# Patient Record
Sex: Female | Born: 1950
Health system: Southern US, Community
[De-identification: ages and names within clinical notes are randomized; demographics above are authoritative.]

## PROBLEM LIST (undated history)

## (undated) DIAGNOSIS — D649 Anemia, unspecified: Secondary | ICD-10-CM

## (undated) DIAGNOSIS — I639 Cerebral infarction, unspecified: Secondary | ICD-10-CM

## (undated) DIAGNOSIS — G459 Transient cerebral ischemic attack, unspecified: Secondary | ICD-10-CM

## (undated) HISTORY — DX: Transient cerebral ischemic attack, unspecified: G45.9

## (undated) HISTORY — DX: Anemia, unspecified: D64.9

---
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
## 2015-05-25 DIAGNOSIS — Z23 Encounter for immunization: Secondary | ICD-10-CM | POA: Diagnosis not present

## 2015-05-25 DIAGNOSIS — M5432 Sciatica, left side: Secondary | ICD-10-CM | POA: Diagnosis not present

## 2016-08-23 DIAGNOSIS — D509 Iron deficiency anemia, unspecified: Secondary | ICD-10-CM | POA: Diagnosis not present

## 2016-08-23 DIAGNOSIS — E785 Hyperlipidemia, unspecified: Secondary | ICD-10-CM | POA: Diagnosis not present

## 2016-08-23 DIAGNOSIS — M2550 Pain in unspecified joint: Secondary | ICD-10-CM | POA: Diagnosis not present

## 2016-08-23 DIAGNOSIS — Z1159 Encounter for screening for other viral diseases: Secondary | ICD-10-CM | POA: Diagnosis not present

## 2016-08-23 DIAGNOSIS — Z23 Encounter for immunization: Secondary | ICD-10-CM | POA: Diagnosis not present

## 2016-08-23 DIAGNOSIS — R609 Edema, unspecified: Secondary | ICD-10-CM | POA: Diagnosis not present

## 2016-08-23 DIAGNOSIS — E559 Vitamin D deficiency, unspecified: Secondary | ICD-10-CM | POA: Diagnosis not present

## 2019-08-04 ENCOUNTER — Emergency Department (HOSPITAL_COMMUNITY): Payer: Medicare Other

## 2019-08-04 ENCOUNTER — Emergency Department (HOSPITAL_COMMUNITY)
Admission: EM | Admit: 2019-08-04 | Discharge: 2019-08-04 | Discharge status: Against Medical Advice | Payer: Medicare Other | Attending: Emergency Medicine | Admitting: Emergency Medicine

## 2019-08-04 ENCOUNTER — Other Ambulatory Visit: Payer: Self-pay

## 2019-08-04 DIAGNOSIS — R413 Other amnesia: Secondary | ICD-10-CM | POA: Insufficient documentation

## 2019-08-04 DIAGNOSIS — Z20822 Contact with and (suspected) exposure to covid-19: Secondary | ICD-10-CM | POA: Insufficient documentation

## 2019-08-04 DIAGNOSIS — Z5320 Procedure and treatment not carried out because of patient's decision for unspecified reasons: Secondary | ICD-10-CM | POA: Insufficient documentation

## 2019-08-04 LAB — CBC
HCT: 40.1 % (ref 36.0–46.0)
Hemoglobin: 12.9 g/dL (ref 12.0–15.0)
MCH: 25.9 pg — ABNORMAL LOW (ref 26.0–34.0)
MCHC: 32.2 g/dL (ref 30.0–36.0)
MCV: 80.5 fL (ref 80.0–100.0)
Platelets: 302 10*3/uL (ref 150–400)
RBC: 4.98 MIL/uL (ref 3.87–5.11)
RDW: 13.2 % (ref 11.5–15.5)
WBC: 5.8 10*3/uL (ref 4.0–10.5)
nRBC: 0 % (ref 0.0–0.2)

## 2019-08-04 LAB — DIFFERENTIAL
Abs Immature Granulocytes: 0.01 10*3/uL (ref 0.00–0.07)
Basophils Absolute: 0 10*3/uL (ref 0.0–0.1)
Basophils Relative: 1 %
Eosinophils Absolute: 0.1 10*3/uL (ref 0.0–0.5)
Eosinophils Relative: 2 %
Immature Granulocytes: 0 %
Lymphocytes Relative: 33 %
Lymphs Abs: 1.9 10*3/uL (ref 0.7–4.0)
Monocytes Absolute: 0.4 10*3/uL (ref 0.1–1.0)
Monocytes Relative: 7 %
Neutro Abs: 3.4 10*3/uL (ref 1.7–7.7)
Neutrophils Relative %: 57 %

## 2019-08-04 LAB — PROTIME-INR
INR: 1 (ref 0.8–1.2)
Prothrombin Time: 13.3 seconds (ref 11.4–15.2)

## 2019-08-04 LAB — COMPREHENSIVE METABOLIC PANEL
ALT: 29 U/L (ref 0–44)
AST: 33 U/L (ref 15–41)
Albumin: 4.4 g/dL (ref 3.5–5.0)
Alkaline Phosphatase: 53 U/L (ref 38–126)
Anion gap: 11 (ref 5–15)
BUN: 19 mg/dL (ref 8–23)
CO2: 26 mmol/L (ref 22–32)
Calcium: 9.7 mg/dL (ref 8.9–10.3)
Chloride: 105 mmol/L (ref 98–111)
Creatinine, Ser: 0.75 mg/dL (ref 0.44–1.00)
GFR calc Af Amer: >60 mL/min (ref >60)
GFR calc non Af Amer: >60 mL/min (ref >60)
Glucose, Bld: 165 mg/dL — ABNORMAL HIGH (ref 70–99)
Potassium: 3.7 mmol/L (ref 3.5–5.1)
Sodium: 142 mmol/L (ref 135–145)
Total Bilirubin: 0.7 mg/dL (ref 0.3–1.2)
Total Protein: 7.7 g/dL (ref 6.5–8.1)

## 2019-08-04 LAB — URINALYSIS, ROUTINE W REFLEX MICROSCOPIC
Bilirubin Urine: NEGATIVE
Glucose, UA: NEGATIVE mg/dL
Hgb urine dipstick: NEGATIVE
Ketones, ur: NEGATIVE mg/dL
Leukocytes,Ua: NEGATIVE
Nitrite: NEGATIVE
Protein, ur: NEGATIVE mg/dL
Specific Gravity, Urine: 1.005 (ref 1.005–1.030)
pH: 6 (ref 5.0–8.0)

## 2019-08-04 LAB — POC SARS CORONAVIRUS 2 AG -  ED: SARS Coronavirus 2 Ag: NEGATIVE

## 2019-08-04 LAB — CBG MONITORING, ED: Glucose-Capillary: 168 mg/dL — ABNORMAL HIGH (ref 70–99)

## 2019-08-04 LAB — APTT: aPTT: 32 seconds (ref 24–36)

## 2019-08-04 MED ORDER — ASPIRIN 81 MG PO CHEW
81.0000 mg | CHEWABLE_TABLET | Freq: Once | ORAL | Status: AC
  Administered 2019-08-04: 81 mg via ORAL
  Filled 2019-08-04: qty 1

## 2019-08-04 MED ORDER — ASPIRIN 81 MG PO CHEW: 81.0000 mg | CHEWABLE_TABLET | Freq: Every day | ORAL | 0 refills | Status: AC

## 2019-08-04 MED ORDER — SODIUM CHLORIDE 0.9% FLUSH
3.0000 mL | Freq: Once | INTRAVENOUS | Status: AC
  Administered 2019-08-04: 3 mL via INTRAVENOUS

## 2019-08-04 NOTE — ED Notes (Signed)
Pt ambulatory to RR independently.   Pt is able to follow commands

## 2019-08-04 NOTE — Discharge Instructions (Signed)
Toni Davila was seen in the ER today for an episode of confusion this morning.  Toni Davila symptoms are resolved since Toni Davila arrival in the ER.    Toni Davila workup in the ER did not show a medical emergency.  However, we recommended admitting Toni Davila overnight to the hospital for more testing - including an MRI.  It is possible she had a "mini stroke, " or TIA, or another medical condition causing Toni Davila confusion today.  We are not able to perform a complete medical evaluation in the emergency room.  She decided to go home instead and signed herself out against medical advice.  Toni Davila daughter at bedside agreed that they would like to leave against our medical advice.  Both patient and daughter understood the risks of leaving, including a stroke at home, paralysis, or even death.  I advised that she start taking aspirin 81 mg daily.  I also advised that she follow up with the neurology office this week.  If Toni Davila has any new or concerning symptoms, including difficulty speaking, slurred speech, drooping face, weakness in Toni Davila arms or legs, chest pain, or lightheadedness, please return to the emergency room immediately.  Thank you.

## 2019-08-04 NOTE — ED Triage Notes (Signed)
Pt to ed with c/o of memory loss this morning. Pt usually knows family members name but this morning could not remember anyone's name. EMS was called out and they did a EKG but patient wouldn't come with them to hospital. Last known normal was last night at 2000.

## 2019-08-04 NOTE — ED Notes (Signed)
Pt belongings returned and is ambulatory out of ED.   

## 2019-08-04 NOTE — ED Notes (Signed)
Pt primary language is [place]. [facility] doe snot offer select service. Interceptor services provided by secretary [phone] and there was not an appropriate Translator availible at this time.   Pt daughter at bedside able to offer some translation.

## 2019-08-04 NOTE — ED Provider Notes (Signed)
[facility]-EMERGENCY DEPT Provider Note   CSN: [id number] Arrival date & time: 08/04/19  1549     History Chief Complaint  Patient presents with  . Memory Loss    Toni Davila is a 69 y.o. female who is [place] speaking (our translators did not understand her dialect, but her daughter and niece by phone were able to translate into [place], she speaks [place] ?), presenting to the ED with abrupt memory loss.  Family members report the patient was in her usual state of health yesterday and feeling well.  She went to bed normally.  However this morning when they came by to visit her she did not recognize the family members names.  She knew who the family numbers were but cannot recall any details.  Normally she is fully oriented and mentally sharp.  They deny that she had any clear motor deficits, including facial droop, weakness in her arms, weakness in her legs.  They deny that she had any slurred speech.  The patient was not complaining of a headache or any other complaints. They said this is never happened to her before.  Family members deny that the patient has any medical issues including urinary infections.  They deny that she had any fevers or chills earlier today.  They deny she has a hx of stroke, TIA, or smoking.  They tell me she is extremely healthy, extremely active, and takes no medications.  Since arriving in the ED, bedside evaluation, the patient was able to name her daughter accurately.  It appears that her memory issues had largely resolved.  She has no complaints for me.    HPI     No past medical history on file.  There are no problems to display for this patient.    OB History   No obstetric history on file.     No family history on file.  Social History   Tobacco Use  . Smoking status: Not on file  Substance Use Topics  . Alcohol use: Not on file  . Drug use: Not on file    Home Medications Prior to Admission  medications   Medication Sig Start Date End Date Taking? Authorizing Provider  aspirin 81 MG chewable tablet Chew 1 tablet (81 mg total) by mouth daily. 08/05/19 11/03/19  Terald Sleeper, MD    Allergies    Patient has no allergy information on record.  Review of Systems   Review of Systems  Constitutional: Negative for chills and fever.  Eyes: Negative for pain and visual disturbance.  Respiratory: Negative for cough and shortness of breath.   Cardiovascular: Negative for chest pain and palpitations.  Gastrointestinal: Negative for abdominal pain, nausea and vomiting.  Genitourinary: Negative for dysuria and hematuria.  Musculoskeletal: Negative for arthralgias and back pain.  Skin: Negative for color change and rash.  Neurological: Negative for syncope, light-headedness and headaches.  Psychiatric/Behavioral: Positive for confusion. Negative for agitation.  All other systems reviewed and are negative.   Physical Exam Updated Vital Signs BP (!) 155/79   Pulse (!) 57   Temp 98 F (36.7 C) (Oral)   Resp 15   Ht 5\' 2"  (1.575 m)   Wt 50.8 kg   SpO2 99%   BMI 20.49 kg/m   Physical Exam Vitals and nursing note reviewed.  Constitutional:      General: She is not in acute distress.    Appearance: She is well-developed. She is not ill-appearing.  HENT:  Head: Normocephalic and atraumatic.  Eyes:     Conjunctiva/sclera: Conjunctivae normal.  Cardiovascular:     Rate and Rhythm: Normal rate and regular rhythm.     Pulses: Normal pulses.  Pulmonary:     Effort: Pulmonary effort is normal. No respiratory distress.     Breath sounds: Normal breath sounds.  Abdominal:     Palpations: Abdomen is soft.     Tenderness: There is no abdominal tenderness.  Musculoskeletal:     Cervical back: Normal range of motion and neck supple.  Skin:    General: Skin is warm and dry.  Neurological:     General: No focal deficit present.     Mental Status: She is alert. Mental status is  at baseline.     GCS: GCS eye subscore is 4. GCS verbal subscore is 5. GCS motor subscore is 6.     Cranial Nerves: No cranial nerve deficit.     Sensory: No sensory deficit.     Motor: Motor function is intact. No weakness.     Coordination: Coordination is intact.     Gait: Gait is intact.  Psychiatric:        Mood and Affect: Mood normal.        Behavior: Behavior normal.     ED Results / Procedures / Treatments   Labs (all labs ordered are listed, but only abnormal results are displayed) Labs Reviewed  CBC - Abnormal; Notable for the following components:      Result Value   MCH 25.9 (*)    All other components within normal limits  COMPREHENSIVE METABOLIC PANEL - Abnormal; Notable for the following components:   Glucose, Bld 165 (*)    All other components within normal limits  URINALYSIS, ROUTINE W REFLEX MICROSCOPIC - Abnormal; Notable for the following components:   Color, Urine STRAW (*)    All other components within normal limits  CBG MONITORING, ED - Abnormal; Notable for the following components:   Glucose-Capillary 168 (*)    All other components within normal limits  PROTIME-INR  APTT  DIFFERENTIAL  I-STAT CHEM 8, ED  POC SARS CORONAVIRUS 2 AG -  ED    EKG EKG Interpretation  Date/Time:  Sunday August 04 2019 16:38:14 EDT Ventricular Rate:  63 PR Interval:    QRS Duration: 113 QT Interval:  427 QTC Calculation: 438 R Axis:   17 Text Interpretation: Sinus rhythm Incomplete right bundle branch block No STEMI Confirmed by Octaviano Glow [phone]) on 08/04/2019 5:32:15 PM   Radiology CT HEAD WO CONTRAST  Result Date: 08/04/2019 CLINICAL DATA:  Memory loss EXAM: CT HEAD WITHOUT CONTRAST TECHNIQUE: Contiguous axial images were obtained from the base of the skull through the vertex without intravenous contrast. COMPARISON:  None. FINDINGS: Brain: No acute intracranial abnormality. Specifically, no hemorrhage, hydrocephalus, mass lesion, acute infarction, or  significant intracranial injury. Vascular: No hyperdense vessel or unexpected calcification. Skull: No acute calvarial abnormality. Sinuses/Orbits: Visualized paranasal sinuses and mastoids clear. Orbital soft tissues unremarkable. Other: None IMPRESSION: No acute intracranial abnormality. Electronically Signed   By: Rolm Baptise M.D.   On: 08/04/2019 16:37    Procedures Procedures (including critical care time)  Medications Ordered in ED Medications  sodium chloride flush (NS) 0.9 % injection 3 mL (3 mLs Intravenous Given 08/04/19 1645)  aspirin chewable tablet 81 mg (81 mg Oral Given 08/04/19 1949)    ED Course  I have reviewed the triage vital signs and the nursing notes.  Pertinent labs &  imaging results that were available during my care of the patient were reviewed by me and considered in my medical decision making (see chart for details).  69 yo female here with transient episode of confusion this morning, now resolved.  Specifically she had disorientation and difficulty naming her family members this morning, which is out of character for her, but she had NO aphasia, slurred speech, or other neurological deficits.    Translation is tricky here as she speaks a dialect of [place] our interpreter services are unfamiliar with.  We rely on her daughter and her niece (who is a Orthoptist, apparently) on the phone, to talk to her and to [place].    She has a completely benign neurological exam.  I certainly don't think this is a stroke, but will discuss with neurology after completing a metabolic and infectious evaluation.    Low suspicion for SAH, meningitis.  This patient complains of confusion.  This involves an extensive number of treatment options, and is a complaint that carries with it a high risk of complications and morbidity.  The differential diagnosis includes TIA, metabolic encephalopathy, global transient amnesia, seizure, UTI, other infection, hypoglycemia, and other  conditions  I ordered, reviewed, and interpreted labs, which included COVID test, UA, BMP, CBC, and CT scan I ordered imaging studies which included CT head I independently visualized and interpreted imaging which showed no intracranial mass or bleed Additional history was obtained from patient's daughter and niece I consulted neurology and discussed lab and imaging findings as noted in ED course below   After the interventions stated above, I reevaluated the patient and found she was still at her baseline mental status.  She had not had confusion since her arrival in the ED.  Clinical Course as of Aug 04 2347  Wynelle Link Aug 04, 2019  1932 I spoke to Dr Amada Jupiter of neurology who recommended transfer to [facility] for admission for TIA evaluation (MRI) and EEG.  However the patient's daughter and the patient do not wish to stay overnight.  They both feel she is back to her baseline mental status, and they would rather f/u as an outpatient.  I explained to them that we have not found a clear cause for her episode, and it may be a TIA or another medical issue, and that she would be leaving against our medical advice.  They understand the risks of leaving.  I'll initiate 81 mg aspirin here and refer to the TIA clinic for rapid outpatient follow up.  I strongly encouraged them to return to the ER if there are new or recurrent symptoms similar to this morning.  Both verbalized understanding.   [MT]    Clinical Course User Index [MT] Veyda Kaufman, Kermit Balo, MD    Final Clinical Impression(s) / ED Diagnoses Final diagnoses:  Memory loss    Rx / DC Orders ED Discharge Orders         Ordered    aspirin 81 MG chewable tablet  Daily     08/04/19 1934    Ambulatory referral to Neurology    Comments: An appointment is requested in approximately: 1-2 days. Referral for TIA clinic.  I spoke to Dr Amada Jupiter while patient was in ER on 08/04/19.  Patient left AMA from the ED prior to MRI scan or EEG completed.    08/04/19 1936           Terald Sleeper, MD 08/04/19 2350

## 2019-08-07 ENCOUNTER — Ambulatory Visit (INDEPENDENT_AMBULATORY_CARE_PROVIDER_SITE_OTHER): Payer: Medicare Other | Admitting: Diagnostic Neuroimaging

## 2019-08-07 ENCOUNTER — Other Ambulatory Visit: Payer: Self-pay

## 2019-08-07 ENCOUNTER — Encounter: Payer: Self-pay | Admitting: Diagnostic Neuroimaging

## 2019-08-07 VITALS — BP 130/80 | HR 68 | Temp 97.6°F | Ht 63.0 in | Wt 148.4 lb

## 2019-08-07 DIAGNOSIS — R6889 Other general symptoms and signs: Secondary | ICD-10-CM | POA: Diagnosis not present

## 2019-08-07 DIAGNOSIS — Z79899 Other long term (current) drug therapy: Secondary | ICD-10-CM | POA: Diagnosis not present

## 2019-08-07 DIAGNOSIS — R4701 Aphasia: Secondary | ICD-10-CM

## 2019-08-07 NOTE — Progress Notes (Signed)
[facility]  PATIENT: Toni Davila DOB: 07-22-1950  REFERRING CLINICIAN: Terald Sleeper, MD HISTORY FROM: patient and daughter and son in law REASON FOR VISIT: new consult    HISTORICAL  CHIEF COMPLAINT:  Chief Complaint  Patient presents with  . Transient Ischemic Attack    rm 6 dgtr- Hdina/interpreter, son-in-law- Koleen Nimrod  "c/o head hurting"    HISTORY OF PRESENT ILLNESS:   69 year old female here for evaluation of transient confusion.  08/04/2019 patient woke up and was having difficulty speaking.  She was generally weak and acting confused.  Patient normally lives with her grandson.  Patient's daughter and son-in-law communicated with the grandson over phone.  911 was called and patient was taken to the hospital.  CT head and lab testing were done which showed no acute findings.  They were advised to be admitted to complete MRI and the EEG but family opted for outpatient work-up.  Since that time symptoms have been persistent.  Patient reverts to speaking an uncommon dialect with her daughter as compared to their main native language.  She is having difficulty understanding as well as naming objects and people.  Today during our evaluation patient is not able to identify her daughter, son-in-law by their name or by the relation.  Patient says it is okay and I do not know in her native language.  Of note patient has been under extremely high stress related to her job and starting on Sunday was complaining of people at her job threatening to get her fired.  Patient also complaining of left frontal and periorbital headaches.  No prior history of headaches.   REVIEW OF SYSTEMS: Full 14 system review of systems performed and negative with exception of: As per HPI.  ALLERGIES: No Known Allergies  HOME MEDICATIONS: Outpatient Medications Prior to Visit  Medication Sig Dispense Refill  . aspirin 81 MG chewable tablet Chew 1 tablet (81 mg total) by mouth daily. 90  tablet 0   No facility-administered medications prior to visit.    PAST MEDICAL HISTORY: Past Medical History:  Diagnosis Date  . Anemia   . TIA (transient ischemic attack)     PAST SURGICAL HISTORY: History reviewed. No pertinent surgical history.  FAMILY HISTORY: No family history on file.  SOCIAL HISTORY: Social History   Socioeconomic History  . Marital status: Single    Spouse name: Not on file  . Number of children: 6  . Years of education: Not on file  . Highest education level: Not on file  Occupational History  . Not on file  Tobacco Use  . Smoking status: Never Smoker  . Smokeless tobacco: Never Used  Substance and Sexual Activity  . Alcohol use: Never  . Drug use: Never  . Sexual activity: Not on file  Other Topics Concern  . Not on file  Social History Narrative   08/07/19 lives with her son   Social Determinants of Health   Financial Resource Strain:   . Difficulty of Paying Living Expenses:   Food Insecurity:   . Worried About Programme researcher, broadcasting/film/video in the Last Year:   . Barista in the Last Year:   Transportation Needs:   . Freight forwarder (Medical):   Marland Kitchen Lack of Transportation (Non-Medical):   Physical Activity:   . Days of Exercise per Week:   . Minutes of Exercise per Session:   Stress:   . Feeling of Stress :   Social Connections:   .  Frequency of Communication with Friends and Family:   . Frequency of Social Gatherings with Friends and Family:   . Attends Religious Services:   . Active Member of Clubs or Organizations:   . Attends Archivist Meetings:   Marland Kitchen Marital Status:   Intimate Partner Violence:   . Fear of Current or Ex-Partner:   . Emotionally Abused:   Marland Kitchen Physically Abused:   . Sexually Abused:      PHYSICAL EXAM  GENERAL EXAM/CONSTITUTIONAL: Vitals:  Vitals:   08/07/19 1351  BP: 130/80  Pulse: 68  Temp: 97.6 F (36.4 C)  Weight: 148 lb 6.4 oz (67.3 kg)  Height: 5\' 3"  (1.6 m)     Body  mass index is 26.29 kg/m. Wt Readings from Last 3 Encounters:  08/07/19 148 lb 6.4 oz (67.3 kg)  08/04/19 112 lb (50.8 kg)     Patient is in no distress; well developed, nourished and groomed; neck is supple  CARDIOVASCULAR:  Examination of carotid arteries is normal; no carotid bruits  Regular rate and rhythm, no murmurs  Examination of peripheral vascular system by observation and palpation is normal  EYES:  Ophthalmoscopic exam of optic discs and posterior segments is normal; no papilledema or hemorrhages  No exam data present  MUSCULOSKELETAL:  Gait, strength, tone, movements noted in Neurologic exam below  NEUROLOGIC: MENTAL STATUS:  No flowsheet data found.  awake, alert; cannot say her own name; cannot say her daughter's name; says I dont know; cannot name city  decr memory   decr attention and concentration  decr fluency; decr comprehension; cannot name pen; cannot name phone  decr fund of knowledge  CRANIAL NERVE:   2nd - no papilledema on fundoscopic exam  2nd, 3rd, 4th, 6th - pupils equal and reactive to light, visual fields full to confrontation, extraocular muscles intact, no nystagmus  5th - facial sensation symmetric  7th - facial strength symmetric  8th - hearing intact  9th - palate elevates symmetrically, uvula midline  11th - shoulder shrug symmetric  12th - tongue protrusion midline  MOTOR:   normal bulk and tone, full strength in the BUE, BLE  SENSORY:   normal and symmetric to light touch  COORDINATION:   finger-nose-finger, fine finger movements normal  REFLEXES:   deep tendon reflexes trace and symmetric  GAIT/STATION:   narrow based gait     DIAGNOSTIC DATA (LABS, IMAGING, TESTING) - I reviewed patient records, labs, notes, testing and imaging myself where available.  Lab Results  Component Value Date   WBC 5.8 08/04/2019   HGB 12.9 08/04/2019   HCT 40.1 08/04/2019   MCV 80.5 08/04/2019   PLT 302  08/04/2019      Component Value Date/Time   NA 142 08/04/2019 1640   K 3.7 08/04/2019 1640   CL 105 08/04/2019 1640   CO2 26 08/04/2019 1640   GLUCOSE 165 (H) 08/04/2019 1640   BUN 19 08/04/2019 1640   CREATININE 0.75 08/04/2019 1640   CALCIUM 9.7 08/04/2019 1640   PROT 7.7 08/04/2019 1640   ALBUMIN 4.4 08/04/2019 1640   AST 33 08/04/2019 1640   ALT 29 08/04/2019 1640   ALKPHOS 53 08/04/2019 1640   BILITOT 0.7 08/04/2019 1640   GFRNONAA >60 08/04/2019 1640   GFRAA >60 08/04/2019 1640   No results found for: CHOL, HDL, LDLCALC, LDLDIRECT, TRIG, CHOLHDL No results found for: HGBA1C No results found for: VITAMINB12 No results found for: TSH   08/04/19 CT head  - negative  ASSESSMENT AND PLAN  69 y.o. year old female here with new onset confusion, aphasia, memory loss on 08/04/2019.  We will proceed with further work-up.  Dx:   1. Aphasia   2. Other general symptoms and signs   3. Long-term use of high-risk medication       PLAN:  APHASIA / CONFUSION (? Stroke vs neurodegenerative vs metabolic vs autoimmune / vasculitis vs stress reaction) - addl workup (labs; MRI brain rule out stroke; autoimmune / inflamm) - continue aspirin 81mg  daily  Orders Placed This Encounter  Procedures  . MR BRAIN W WO CONTRAST  . Vitamin B12  . Hemoglobin A1c  . TSH  . Sedimentation Rate  . C-reactive Protein   Return pending test results, for pending if symptoms worsen or fail to improve.    , MD 08/07/2019, 2:12 PM Certified in Neurology, Neurophysiology and Neuroimaging  [facility] [address] [place], [place] [phone]

## 2019-08-08 ENCOUNTER — Other Ambulatory Visit: Payer: Self-pay

## 2019-08-08 ENCOUNTER — Other Ambulatory Visit (HOSPITAL_COMMUNITY): Payer: Self-pay

## 2019-08-08 ENCOUNTER — Encounter: Payer: Self-pay | Admitting: Diagnostic Neuroimaging

## 2019-08-08 ENCOUNTER — Other Ambulatory Visit: Payer: Self-pay | Admitting: Diagnostic Neuroimaging

## 2019-08-08 ENCOUNTER — Telehealth: Payer: Self-pay | Admitting: Diagnostic Neuroimaging

## 2019-08-08 ENCOUNTER — Telehealth: Payer: Self-pay | Admitting: *Deleted

## 2019-08-08 ENCOUNTER — Inpatient Hospital Stay (HOSPITAL_COMMUNITY)
Admission: EM | Admit: 2019-08-08 | Discharge: 2019-08-10 | DRG: 066 | Disposition: A | Discharge status: Home Health | Payer: Medicare Other | Attending: Internal Medicine | Admitting: Internal Medicine

## 2019-08-08 ENCOUNTER — Ambulatory Visit (HOSPITAL_COMMUNITY)
Admission: RE | Admit: 2019-08-08 | Discharge: 2019-08-08 | Disposition: A | Discharge status: Home / Self Care | Payer: Medicare Other | Source: Ambulatory Visit | Attending: Diagnostic Neuroimaging | Admitting: Diagnostic Neuroimaging

## 2019-08-08 DIAGNOSIS — I6389 Other cerebral infarction: Secondary | ICD-10-CM | POA: Diagnosis not present

## 2019-08-08 DIAGNOSIS — R29702 NIHSS score 2: Secondary | ICD-10-CM | POA: Diagnosis not present

## 2019-08-08 DIAGNOSIS — D649 Anemia, unspecified: Secondary | ICD-10-CM | POA: Diagnosis present

## 2019-08-08 DIAGNOSIS — R4701 Aphasia: Secondary | ICD-10-CM

## 2019-08-08 DIAGNOSIS — I639 Cerebral infarction, unspecified: Secondary | ICD-10-CM

## 2019-08-08 DIAGNOSIS — I63512 Cerebral infarction due to unspecified occlusion or stenosis of left middle cerebral artery: Principal | ICD-10-CM

## 2019-08-08 DIAGNOSIS — Z20822 Contact with and (suspected) exposure to covid-19: Secondary | ICD-10-CM | POA: Diagnosis present

## 2019-08-08 DIAGNOSIS — Z79899 Other long term (current) drug therapy: Secondary | ICD-10-CM

## 2019-08-08 DIAGNOSIS — R29703 NIHSS score 3: Secondary | ICD-10-CM | POA: Diagnosis present

## 2019-08-08 DIAGNOSIS — Z7982 Long term (current) use of aspirin: Secondary | ICD-10-CM | POA: Diagnosis not present

## 2019-08-08 DIAGNOSIS — R29704 NIHSS score 4: Secondary | ICD-10-CM | POA: Diagnosis not present

## 2019-08-08 DIAGNOSIS — R6889 Other general symptoms and signs: Secondary | ICD-10-CM | POA: Insufficient documentation

## 2019-08-08 DIAGNOSIS — I10 Essential (primary) hypertension: Secondary | ICD-10-CM | POA: Diagnosis present

## 2019-08-08 DIAGNOSIS — Z8673 Personal history of transient ischemic attack (TIA), and cerebral infarction without residual deficits: Secondary | ICD-10-CM

## 2019-08-08 DIAGNOSIS — R410 Disorientation, unspecified: Secondary | ICD-10-CM | POA: Diagnosis present

## 2019-08-08 HISTORY — DX: Cerebral infarction, unspecified: I63.9

## 2019-08-08 LAB — CBC
HCT: 38.9 % (ref 36.0–46.0)
Hemoglobin: 12.3 g/dL (ref 12.0–15.0)
MCH: 25.8 pg — ABNORMAL LOW (ref 26.0–34.0)
MCHC: 31.6 g/dL (ref 30.0–36.0)
MCV: 81.7 fL (ref 80.0–100.0)
Platelets: 281 10*3/uL (ref 150–400)
RBC: 4.76 MIL/uL (ref 3.87–5.11)
RDW: 13 % (ref 11.5–15.5)
WBC: 7.2 10*3/uL (ref 4.0–10.5)
nRBC: 0 % (ref 0.0–0.2)

## 2019-08-08 LAB — I-STAT CHEM 8, ED
BUN: 16 mg/dL (ref 8–23)
Calcium, Ion: 1.16 mmol/L (ref 1.15–1.40)
Chloride: 106 mmol/L (ref 98–111)
Creatinine, Ser: 0.8 mg/dL (ref 0.44–1.00)
Glucose, Bld: 88 mg/dL (ref 70–99)
HCT: 38 % (ref 36.0–46.0)
Hemoglobin: 12.9 g/dL (ref 12.0–15.0)
Potassium: 3.9 mmol/L (ref 3.5–5.1)
Sodium: 140 mmol/L (ref 135–145)
TCO2: 27 mmol/L (ref 22–32)

## 2019-08-08 LAB — COMPREHENSIVE METABOLIC PANEL
ALT: 29 U/L (ref 0–44)
AST: 29 U/L (ref 15–41)
Albumin: 4.1 g/dL (ref 3.5–5.0)
Alkaline Phosphatase: 49 U/L (ref 38–126)
Anion gap: 9 (ref 5–15)
BUN: 13 mg/dL (ref 8–23)
CO2: 25 mmol/L (ref 22–32)
Calcium: 9.4 mg/dL (ref 8.9–10.3)
Chloride: 108 mmol/L (ref 98–111)
Creatinine, Ser: 0.83 mg/dL (ref 0.44–1.00)
GFR calc Af Amer: >60 mL/min (ref >60)
GFR calc non Af Amer: >60 mL/min (ref >60)
Glucose, Bld: 94 mg/dL (ref 70–99)
Potassium: 4.1 mmol/L (ref 3.5–5.1)
Sodium: 142 mmol/L (ref 135–145)
Total Bilirubin: 0.5 mg/dL (ref 0.3–1.2)
Total Protein: 7.4 g/dL (ref 6.5–8.1)

## 2019-08-08 LAB — TSH: TSH: 0.821 u[IU]/mL (ref 0.450–4.500)

## 2019-08-08 LAB — DIFFERENTIAL
Abs Immature Granulocytes: 0.03 10*3/uL (ref 0.00–0.07)
Basophils Absolute: 0 10*3/uL (ref 0.0–0.1)
Basophils Relative: 0 %
Eosinophils Absolute: 0.2 10*3/uL (ref 0.0–0.5)
Eosinophils Relative: 2 %
Immature Granulocytes: 0 %
Lymphocytes Relative: 33 %
Lymphs Abs: 2.3 10*3/uL (ref 0.7–4.0)
Monocytes Absolute: 0.5 10*3/uL (ref 0.1–1.0)
Monocytes Relative: 7 %
Neutro Abs: 4.1 10*3/uL (ref 1.7–7.7)
Neutrophils Relative %: 58 %

## 2019-08-08 LAB — HEMOGLOBIN A1C
Est. average glucose Bld gHb Est-mCnc: 103 mg/dL
Hgb A1c MFr Bld: 5.2 % (ref 4.8–5.6)

## 2019-08-08 LAB — PROTIME-INR
INR: 1 (ref 0.8–1.2)
Prothrombin Time: 12.8 seconds (ref 11.4–15.2)

## 2019-08-08 LAB — APTT: aPTT: 33 seconds (ref 24–36)

## 2019-08-08 LAB — VITAMIN B12: Vitamin B-12: 802 pg/mL (ref 232–1245)

## 2019-08-08 LAB — C-REACTIVE PROTEIN: CRP: <1 mg/L (ref 0–10)

## 2019-08-08 LAB — SEDIMENTATION RATE: Sed Rate: 40 mm/hr (ref 0–40)

## 2019-08-08 MED ORDER — SODIUM CHLORIDE 0.9% FLUSH: 3.0000 mL | Freq: Once | INTRAVENOUS | Status: DC

## 2019-08-08 MED ORDER — GADOBUTROL 1 MMOL/ML IV SOLN
6.5000 mL | Freq: Once | INTRAVENOUS | Status: AC | PRN
  Administered 2019-08-08: 17:00:00 6.5 mL via INTRAVENOUS

## 2019-08-08 NOTE — ED Provider Notes (Signed)
[facility] EMERGENCY DEPARTMENT Provider Note   CSN: [id number] Arrival date & time: 08/08/19  1628     History Chief Complaint  Patient presents with  . dx stroke    Jordynn Marcella is a 69 y.o. female.  LEVEL FIVE CAVEAT DUE TO CONFUSION  HPI HPI Comments: Kamika Goodloe is a 69 y.o. female who presents to the Emergency Department complaining of a stroke.  Patient was seen 4 days ago with strokelike symptoms and had a negative CT at the time.  Admission was discussed with patient and her daughter and they declined stating that she appeared to be at her baseline mental status and they left AMA.  They followed up with Dr. Marjory Lies and had an MRI performed today showing a moderate sized subacute left MCA infarct.  They were then sent to the emergency department today for reevaluation and possible admission.  Her children are translating for her and state that she has been confused for the last 4 days.  They state that it is as if she "speaks a different language", "she is typically very private and will now sometimes take off her clothes in front of family members", "she hallucinates about people trying to get her". Before her symptom onset four days ago she did not experience any of these symptoms. When asked, pt denies any sx at this time.   Past Medical History:  Diagnosis Date  . Anemia   . TIA (transient ischemic attack)     There are no problems to display for this patient.   No past surgical history on file.   OB History   No obstetric history on file.     No family history on file.  Social History   Tobacco Use  . Smoking status: Never Smoker  . Smokeless tobacco: Never Used  Substance Use Topics  . Alcohol use: Never  . Drug use: Never    Home Medications Prior to Admission medications   Medication Sig Start Date End Date Taking? Authorizing Provider  aspirin 81 MG chewable tablet Chew 1 tablet (81 mg total) by mouth daily. 08/05/19 11/03/19  Terald Sleeper, MD    Allergies    Patient has no known allergies.  Review of Systems   Review of Systems  Unable to perform ROS: Mental status change   Physical Exam Updated Vital Signs BP (!) 162/61 (BP Location: Right Arm)   Pulse 61   Temp 98.1 F (36.7 C) (Oral)   Resp 18   SpO2 100%   Physical Exam Vitals and nursing note reviewed.  Constitutional:      Appearance: Normal appearance. She is normal weight.  HENT:     Head: Normocephalic and atraumatic.     Right Ear: External ear normal.     Left Ear: External ear normal.     Nose: Nose normal.  Eyes:     General: No scleral icterus.       Right eye: No discharge.        Left eye: No discharge.     Extraocular Movements: Extraocular movements intact.     Conjunctiva/sclera: Conjunctivae normal.  Cardiovascular:     Rate and Rhythm: Regular rhythm. Bradycardia present.     Pulses: Normal pulses.     Heart sounds: Normal heart sounds. No murmur. No friction rub. No gallop.   Pulmonary:     Effort: Pulmonary effort is normal. No respiratory distress.     Breath sounds: Normal breath sounds. No stridor.  No wheezing, rhonchi or rales.  Abdominal:     General: Abdomen is flat.  Musculoskeletal:     Cervical back: Normal range of motion and neck supple.  Skin:    General: Skin is warm and dry.     Capillary Refill: Capillary refill takes less than 2 seconds.  Neurological:     Mental Status: She is disoriented.     Motor: No weakness.     Comments: Strength is 5 out of 5 in the bilateral upper and lower extremities.  Distal sensation is intact.  Negative pronator drift.  Unable to complete finger-to-nose bilaterally due to patient confusion.  Patient's children are translating for her and states that she is confused and much of what she says is difficult to comprehend.  They state she is not oriented to place or time.    ED Results / Procedures / Treatments   Labs (all labs ordered are listed, but only abnormal  results are displayed) Labs Reviewed  CBC - Abnormal; Notable for the following components:      Result Value   MCH 25.8 (*)    All other components within normal limits  SARS CORONAVIRUS 2 (TAT 6-24 HRS)  PROTIME-INR  APTT  DIFFERENTIAL  COMPREHENSIVE METABOLIC PANEL  URINALYSIS, ROUTINE W REFLEX MICROSCOPIC  I-STAT CHEM 8, ED    EKG None  Radiology MR BRAIN W WO CONTRAST  Result Date: 08/08/2019 CLINICAL DATA:  68 year old female with transient confusion, aphasia. Woke on 08/04/2019 with symptoms. EXAM: MRI HEAD WITHOUT AND WITH CONTRAST TECHNIQUE: Multiplanar, multiecho pulse sequences of the brain and surrounding structures were obtained without and with intravenous contrast. CONTRAST:  6.45mL GADAVIST GADOBUTROL 1 MMOL/ML IV SOLN COMPARISON:  Head CT without contrast [facility] 08/04/2019. FINDINGS: Study is intermittently degraded by motion artifact despite repeated imaging attempts. Brain: Confluent restricted diffusion in the posterior and inferior insula, posterior left temporal lobe, and more anterior left temporal lobe white matter. Abnormal diffusion tracks cephalad in the left periatrial white matter. Infarct encompasses a 4-5 cm area. Associated T2 and FLAIR hyperintensity, T1 hypointensity. No evidence of associated hemorrhage. No significant mass effect. No significant enhancement. No significant left deep gray nuclei involvement. No contralateral right hemisphere or posterior fossa restricted diffusion. Underlying minimal to mild for age nonspecific white matter T2 and FLAIR hyperintensity. No cortical encephalomalacia or chronic cerebral blood products identified. No abnormal intracranial enhancement. No restricted midline shift, mass effect, evidence of mass lesion, ventriculomegaly, extra-axial collection or acute intracranial hemorrhage. Cervicomedullary junction and pituitary are within normal limits. Vascular: Major intracranial vascular flow voids are preserved.  Although susceptibility at the left MCA bifurcation might reflect thrombus (series 7, image 35). The major dural venous sinuses are enhancing and appear to be patent. Skull and upper cervical spine: Negative visible cervical spine and bone marrow signal. Sinuses/Orbits: Negative. Other: Mastoids are clear. Grossly normal internal auditory structures. IMPRESSION: 1. Moderate size subacute Left MCA territory infarct centered at the posterior temporal lobe and insula. No associated hemorrhage or significant mass effect. 2. Major vascular flow voids are preserved, but susceptibility at the left MCA bifurcation raises the possibility thrombus in the vessel there. 3. Elsewhere largely unremarkable for age MRI appearance of the brain. These results will be called to the ordering clinician or representative by the Radiologist Assistant, and communication documented in the PACS or [employer]. Electronically Signed   By: Genevie Ann M.D.   On: 08/08/2019 16:56   Procedures Procedures   Medications Ordered in  ED Medications  sodium chloride flush (NS) 0.9 % injection 3 mL (3 mLs Intravenous Not Given 08/08/19 2310)    ED Course  I have reviewed the triage vital signs and the nursing notes.  Pertinent labs & imaging results that were available during my care of the patient were reviewed by me and considered in my medical decision making (see chart for details).    MDM Rules/Calculators/A&P                      11:20 PM patient is a 69 year old female who presents with 4 days of confusion.  She was seen in the ED 4 days ago and had a negative CT scan of the head and was offered admission for MRI and EEG after discussion with Dr. Amada Jupiter.  She and her daughter left AMA and followed up with Dr. Marjory Lies outpatient.  She had a MRI of the brain performed today which shows a moderate size subacute left MCA infarct.  They were sent back to the ED for reevaluation and possible admission.  I spoke to Dr. Jerrell Belfast  who recommends she be admitted for stroke work-up and consultation.  Will discuss with hospitalist.  11:21 PM I spoke to Dr. Toniann Fail who will admit the patient.  Final Clinical Impression(s) / ED Diagnoses Final diagnoses:  Confusion  Cerebrovascular accident (CVA), unspecified mechanism [facility])   Rx / DC Orders ED Discharge Orders    None       Placido Sou, PA-C 08/08/19 2337    Charlynne Pander, MD 08/09/19 [phone]

## 2019-08-08 NOTE — Telephone Encounter (Signed)
Patient is scheduled at [facility] for today and to arrive at 2:30 pm. .. I left a voicemail on the patient daughter phone to inform her of her mothers appt.

## 2019-08-08 NOTE — Telephone Encounter (Signed)
I called again and I was about to get a hold of her daughter she is aware that her mother needs to be at [facility] at 2:30 pm. She stated her brother will take her today.Toni Davila

## 2019-08-08 NOTE — ED Triage Notes (Signed)
To ED for eval of Dx stroke. Brought to ED from MRI. Pt denies HA. Per family, pt fell on Sunday. Seen by EMS and taken to [facility] - sent home for MRI today. Pt with some verbal deficits per family. Pt alert and oriented. Interp used.

## 2019-08-08 NOTE — Consult Note (Addendum)
Neurology Consultation  Reason for Consult: Stroke Referring Physician: Moody Bruins, EDP/Dr. Bevelyn Ngo hospitalist  CC: Confusion, speech problem  History is obtained from: Patient's family at bedside, chart  HPI: Toni Davila is a 69 y.o. female no significant past medical history who came for an evaluation to the emergency room after being sent by the outpatient neurology clinic when her MRI that was done for symptoms of confusion and speech problems revealed a left MCA territory stroke. The patient had started having some symptoms of what family thought was confusion somewhere around April 3.  She was brought in for evaluation at [facility] emergency room on April 4, and was recommended to come in for TIA/stroke work-up but the family thought that she is back to baseline and decided to follow-up outpatient.  She followed up in the outpatient neurology clinic yesterday, was recommended an MRI of the brain today with concern for aphasia and possible left-sided stroke, and the MRI confirmed a left temporal stroke.  She was then sent to the emergency room for inpatient neurological evaluation and stroke work-up and admission. Family-son and daughter at bedside reports that she has not had a stroke in the past.  No family history of stroke.  She is otherwise very healthy and very cognitively intact. They started to notice over the past few days that she has been becoming cognitively slow, it appeared that she was not able to recollect things and name objects.  She was able to understand them but was not able to speak normally. She speaks a very uncommon [place] dialect, hence the examination was somewhat reliant on interpretation by family members.  No nausea vomiting.  No chest pain shortness of breath.  No cough fevers chills.  No headaches.  No tingling numbness weakness.  No gait difficulty.  LKW: Sometime in August 03, 2019 tpa given?: no, outside the window-also outside the  time window for EVT. Premorbid modified Rankin scale (mRS): 0 ROS: Review of systems performed and negative except noted in HPI.  Past Medical History:  Diagnosis Date  . Anemia   . TIA (transient ischemic attack)     No family history on file. Parental history not known-they have been deceased for a while.  Social History:   reports that she has never smoked. She has never used smokeless tobacco. She reports that she does not drink alcohol or use drugs.  No tobacco alcohol or drugs.  Medications  Current Facility-Administered Medications:  .  sodium chloride flush (NS) 0.9 % injection 3 mL, 3 mL, Intravenous, Once, Pattricia Boss, MD  Current Outpatient Medications:  .  aspirin 81 MG chewable tablet, Chew 1 tablet (81 mg total) by mouth daily., Disp: 90 tablet, Rfl: 0 Aspirin was started after the ED visit on 08/04/2019.  Prior to that was not on aspirin.  Exam: Current vital signs: BP (!) 162/61 (BP Location: Right Arm)   Pulse 61   Temp 98.1 F (36.7 C) (Oral)   Resp 18   SpO2 100%  Vital signs in last 24 hours: Temp:  [98.1 F (36.7 C)] 98.1 F (36.7 C) (04/08 1641) Pulse Rate:  [55-61] 61 (04/08 2302) Resp:  [16-18] 18 (04/08 2302) BP: (159-175)/(43-66) 162/61 (04/08 2302) SpO2:  [98 %-100 %] 100 % (04/08 2302)  GENERAL: Awake, alert in NAD HEENT: - Normocephalic and atraumatic, dry mm, no LN++, no Thyromegally LUNGS - Clear to auscultation bilaterally with no wheezes CV - S1S2 RRR, no m/r/g, equal pulses bilaterally. ABDOMEN -  Soft, nontender, nondistended with normoactive BS Ext: warm, well perfused, intact peripheral pulses, no edema  NEURO:  Mental Status: AA&Ox1  Language: speech is nondysarthric.  Unable to name.  Follows simple commands consistently.  Unable to repeat.  Fluency is impaired. Cranial Nerves: PERRL EOMI, visual fields full, no facial asymmetry, facial sensation intact, hearing intact, tongue/uvula/soft palate midline, normal  sternocleidomastoid and trapezius muscle strength. No evidence of tongue atrophy or fibrillations Motor: 5/5 in all fours without any drift Tone: is normal and bulk is normal Sensation- Intact to light touch bilaterally, no evidence of extinction Coordination: FTN intact bilaterally Gait- deferred  NIHSS-4   Labs I have reviewed labs in epic and the results pertinent to this consultation are: CBC    Component Value Date/Time   WBC 7.2 08/08/2019 1644   RBC 4.76 08/08/2019 1644   HGB 12.9 08/08/2019 1709   HCT 38.0 08/08/2019 1709   PLT 281 08/08/2019 1644   MCV 81.7 08/08/2019 1644   MCH 25.8 (L) 08/08/2019 1644   MCHC 31.6 08/08/2019 1644   RDW 13.0 08/08/2019 1644   LYMPHSABS 2.3 08/08/2019 1644   MONOABS 0.5 08/08/2019 1644   EOSABS 0.2 08/08/2019 1644   BASOSABS 0.0 08/08/2019 1644    CMP     Component Value Date/Time   NA 140 08/08/2019 1709   K 3.9 08/08/2019 1709   CL 106 08/08/2019 1709   CO2 25 08/08/2019 1644   GLUCOSE 88 08/08/2019 1709   BUN 16 08/08/2019 1709   CREATININE 0.80 08/08/2019 1709   CALCIUM 9.4 08/08/2019 1644   PROT 7.4 08/08/2019 1644   ALBUMIN 4.1 08/08/2019 1644   AST 29 08/08/2019 1644   ALT 29 08/08/2019 1644   ALKPHOS 49 08/08/2019 1644   BILITOT 0.5 08/08/2019 1644   GFRNONAA >60 08/08/2019 1644   GFRAA >60 08/08/2019 1644    Imaging I have reviewed the images obtained: MRI examination of the brain-moderate-sized subacute left MCA infarct centered in the posterior temporal lobe and insula.  A couple of other punctate areas of restricted diffusion in the left MCA territory. Formal radiology reading impression as below: IMPRESSION: 1. Moderate size subacute Left MCA territory infarct centered at the posterior temporal lobe and insula. No associated hemorrhage or significant mass effect. 2. Major vascular flow voids are preserved, but susceptibility at the left MCA bifurcation raises the possibility thrombus in the vessel  there. 3. Elsewhere largely unremarkable for age MRI appearance of the Brain.    Assessment:  69 year old with now about 4 to 5 days of word finding difficulty and confusion presenting to the emergency room for evaluation after an outpatient MRI revealed a moderate sized left MCA stroke. Will benefit from risk factor work-up. Etiology-likely cardioembolic.  Cannot rule out an atheroembolic etiology.  Impression: Acute ischemic stroke  Recommendations: Admit to hospitalist Frequent neurochecks Telemetry Aspirin 325 Atorvastatin 80 CTA head and neck 2D echo A1c Lipid panel PT OT Speech therapy N.p.o. until cleared by bedside swallow evaluation. No need for permissive hypertension -symptoms have now been ongoing for at least 4 to 5 days.  Can normalize blood pressure with discharge blood pressure goal less than 140/90.  Stroke team will follow with you  I have answered all the questions that the family had at bedside.  I have also relayed my plan to the ED provider calling in the consult.  -- Milon Dikes, MD [facility] Pager: [phone] If 7pm to 7am, please call on call as listed on AMION.

## 2019-08-08 NOTE — Progress Notes (Signed)
Pt taken to ED lobby at [facility] in @ 1630

## 2019-08-08 NOTE — H&P (Signed)
History and Physical   Toni Davila GHW:[id number] DOB: [birth year] DOA: 08/08/2019  Referring MD/NP/PA: Dr. Darl Householder  PCP: Toni Jordan, MD   Outpatient Specialists: Dr. Leta Davila  Patient coming from: Home  Chief Complaint: Confusion  HPI: Toni Davila is a 69 y.o. female with medical history significant of anemia who was seen 4 days ago in the emergency room after transient confusion.  Patient woke up having difficulty speaking.  She was weak and confused.  She lives with her grandson.  When daughter and son-in-law communicated with a grandson over the phone they decided to refer them to to the hospital.  9 1 was called and patient came to the ER.  At that time work-up was completed including head CT and lab with no acute findings.  Recommendation was for inpatient admission for CVA work-up including EEG.  Family opted for outpatient work-up so they were referred to Dr. Leta Davila.  Patient was seen by Dr. Leta Davila yesterday and MRI was ordered today which came back showing subacute MCA territory infarct.  Patient was sent to the ER and is being readmitted to the hospital now for full CVA work-up.  She is much better and close to baseline now..  ED Course: Temperature 98.1 blood pressure 175/43 pulse 55 respirate of 18 oxygen sat 98% on room air.  CBC and chemistry were both within normal.  INR 1.0.  MRI is reviewed as above.  Patient will be admitted for full CVA work-up.  Patient to be follow-up by neurology.  Review of Systems: As per HPI otherwise 10 point review of systems negative.    Past Medical History:  Diagnosis Date  . Anemia   . TIA (transient ischemic attack)     No past surgical history on file.   reports that she has never smoked. She has never used smokeless tobacco. She reports that she does not drink alcohol or use drugs.  No Known Allergies  No family history on file.   Prior to Admission medications   Medication Sig Start Date End Date Taking? Authorizing Provider    aspirin 81 MG chewable tablet Chew 1 tablet (81 mg total) by mouth daily. 08/05/19 11/03/19  Wyvonnia Dusky, MD    Physical Exam: Vitals:   08/08/19 1641 08/08/19 2010 08/08/19 2302  BP: (!) 175/43 (!) 159/66 (!) 162/61  Pulse: (!) 55 (!) 57 61  Resp: 16 16 18   Temp: 98.1 F (36.7 C)    TempSrc: Oral    SpO2: 100% 98% 100%      Constitutional: Non-English-speaking, mildly anxious NAD, calm, comfortable Vitals:   08/08/19 1641 08/08/19 2010 08/08/19 2302  BP: (!) 175/43 (!) 159/66 (!) 162/61  Pulse: (!) 55 (!) 57 61  Resp: 16 16 18   Temp: 98.1 F (36.7 C)    TempSrc: Oral    SpO2: 100% 98% 100%   Eyes: PERRL, lids and conjunctivae normal ENMT: Mucous membranes are moist. Posterior pharynx clear of any exudate or lesions.Normal dentition.  Neck: normal, supple, no masses, no thyromegaly Respiratory: clear to auscultation bilaterally, no wheezing, no crackles. Normal respiratory effort. No accessory muscle use.  Cardiovascular: Sinus bradycardia, no murmurs / rubs / gallops. No extremity edema. 2+ pedal pulses. No carotid bruits.  Abdomen: no tenderness, no masses palpated. No hepatosplenomegaly. Bowel sounds positive.  Musculoskeletal: no clubbing / cyanosis. No joint deformity upper and lower extremities. Good ROM, no contractures. Normal muscle tone.  Skin: no rashes, lesions, ulcers. No induration Neurologic: CN 2-12 grossly intact. Sensation intact,  DTR normal. Strength 5/5 in all 4.  No focal findings Psychiatric: Minor confusion otherwise no abnormal behaviors    Labs on Admission: I have personally reviewed following labs and imaging studies  CBC: Recent Labs  Lab 08/04/19 1640 08/08/19 1644 08/08/19 1709  WBC 5.8 7.2  --   NEUTROABS 3.4 4.1  --   HGB 12.9 12.3 12.9  HCT 40.1 38.9 38.0  MCV 80.5 81.7  --   PLT 302 281  --    Basic Metabolic Panel: Recent Labs  Lab 08/04/19 1640 08/08/19 1644 08/08/19 1709  NA 142 142 140  K 3.7 4.1 3.9  CL 105 108  106  CO2 26 25  --   GLUCOSE 165* 94 88  BUN 19 13 16   CREATININE 0.75 0.83 0.80  CALCIUM 9.7 9.4  --    GFR: Estimated Creatinine Clearance: 61.2 mL/min (by C-G formula based on SCr of 0.8 mg/dL). Liver Function Tests: Recent Labs  Lab 08/04/19 1640 08/08/19 1644  AST 33 29  ALT 29 29  ALKPHOS 53 49  BILITOT 0.7 0.5  PROT 7.7 7.4  ALBUMIN 4.4 4.1   No results for input(s): LIPASE, AMYLASE in the last 168 hours. No results for input(s): AMMONIA in the last 168 hours. Coagulation Profile: Recent Labs  Lab 08/04/19 1640 08/08/19 1644  INR 1.0 1.0   Cardiac Enzymes: No results for input(s): CKTOTAL, CKMB, CKMBINDEX, TROPONINI in the last 168 hours. BNP (last 3 results) No results for input(s): PROBNP in the last 8760 hours. HbA1C: Recent Labs    08/07/19 1451  HGBA1C 5.2   CBG: Recent Labs  Lab 08/04/19 1640  GLUCAP 168*   Lipid Profile: No results for input(s): CHOL, HDL, LDLCALC, TRIG, CHOLHDL, LDLDIRECT in the last 72 hours. Thyroid Function Tests: Recent Labs    08/07/19 1451  TSH 0.821   Anemia Panel: Recent Labs    08/07/19 1451  VITAMINB12 802   Urine analysis:    Component Value Date/Time   COLORURINE STRAW (A) 08/04/2019 1725   APPEARANCEUR CLEAR 08/04/2019 1725   LABSPEC 1.005 08/04/2019 1725   PHURINE 6.0 08/04/2019 1725   GLUCOSEU NEGATIVE 08/04/2019 1725   HGBUR NEGATIVE 08/04/2019 1725   BILIRUBINUR NEGATIVE 08/04/2019 1725   KETONESUR NEGATIVE 08/04/2019 1725   PROTEINUR NEGATIVE 08/04/2019 1725   NITRITE NEGATIVE 08/04/2019 1725   LEUKOCYTESUR NEGATIVE 08/04/2019 1725   Sepsis Labs: @LABRCNTIP (procalcitonin:4,lacticidven:4) )No results found for this or any previous visit (from the past 240 hour(s)).   Radiological Exams on Admission: MR BRAIN W WO CONTRAST  Result Date: 08/08/2019 CLINICAL DATA:  69 year old female with transient confusion, aphasia. Woke on 08/04/2019 with symptoms. EXAM: MRI HEAD WITHOUT AND WITH CONTRAST  TECHNIQUE: Multiplanar, multiecho pulse sequences of the brain and surrounding structures were obtained without and with intravenous contrast. CONTRAST:  6.10mL GADAVIST GADOBUTROL 1 MMOL/ML IV SOLN COMPARISON:  Head CT without contrast [facility] 08/04/2019. FINDINGS: Study is intermittently degraded by motion artifact despite repeated imaging attempts. Brain: Confluent restricted diffusion in the posterior and inferior insula, posterior left temporal lobe, and more anterior left temporal lobe white matter. Abnormal diffusion tracks cephalad in the left periatrial white matter. Infarct encompasses a 4-5 cm area. Associated T2 and FLAIR hyperintensity, T1 hypointensity. No evidence of associated hemorrhage. No significant mass effect. No significant enhancement. No significant left deep gray nuclei involvement. No contralateral right hemisphere or posterior fossa restricted diffusion. Underlying minimal to mild for age nonspecific white matter T2 and FLAIR hyperintensity.  No cortical encephalomalacia or chronic cerebral blood products identified. No abnormal intracranial enhancement. No restricted midline shift, mass effect, evidence of mass lesion, ventriculomegaly, extra-axial collection or acute intracranial hemorrhage. Cervicomedullary junction and pituitary are within normal limits. Vascular: Major intracranial vascular flow voids are preserved. Although susceptibility at the left MCA bifurcation might reflect thrombus (series 7, image 35). The major dural venous sinuses are enhancing and appear to be patent. Skull and upper cervical spine: Negative visible cervical spine and bone marrow signal. Sinuses/Orbits: Negative. Other: Mastoids are clear. Grossly normal internal auditory structures. IMPRESSION: 1. Moderate size subacute Left MCA territory infarct centered at the posterior temporal lobe and insula. No associated hemorrhage or significant mass effect. 2. Major vascular flow voids are preserved,  but susceptibility at the left MCA bifurcation raises the possibility thrombus in the vessel there. 3. Elsewhere largely unremarkable for age MRI appearance of the brain. These results will be called to the ordering clinician or representative by the Radiologist Assistant, and communication documented in the PACS or [employer]. Electronically Signed   By: Odessa Fleming M.D.   On: 08/08/2019 16:56    EKG: Independently reviewed.  It shows sinus bradycardia with a rate of 51, nonspecific ST changes especially in the lateral leads  Assessment/Plan Principal Problem:   Acute ischemic left MCA stroke (HCC) Active Problems:   Benign essential HTN     #1 subacute CVA: Involving the left MCA territory.  Patient will be admitted for risk stratification.  Blood pressure noted to be elevated with no prior diagnosis of hypertension.  We will place patient on aspirin and statin.  Echocardiogram and carotid Dopplers will be pursued.  Follow further recommendations from neurology.  PT and OT as well as speech therapy consultation.  #2 elevated blood pressure: For now permissive hypertension.  May have underlying hypertension that is unknown to the patient.  Continue treatment  #3 history of anemia: H&H at this point is stable.  Continue monitoring.   DVT prophylaxis: Lovenox Code Status: Full code Family Communication: Daughter and son-in-law Disposition Plan: To be determined Consults called: Neurology Dr. Wilford Corner Admission status: Inpatient  Severity of Illness: The appropriate patient status for this patient is INPATIENT. Inpatient status is judged to be reasonable and necessary in order to provide the required intensity of service to ensure the patient's safety. The patient's presenting symptoms, physical exam findings, and initial radiographic and laboratory data in the context of their chronic comorbidities is felt to place them at high risk for further clinical deterioration. Furthermore, it is not  anticipated that the patient will be medically stable for discharge from the hospital within 2 midnights of admission. The following factors support the patient status of inpatient.   " The patient's presenting symptoms include confusion. " The worrisome physical exam findings include mild confusion. " The initial radiographic and laboratory data are worrisome because of evidence of subacute infarct on MRI. " The chronic co-morbidities include anemia.   * I certify that at the point of admission it is my clinical judgment that the patient will require inpatient hospital care spanning beyond 2 midnights from the point of admission due to high intensity of service, high risk for further deterioration and high frequency of surveillance required.Lonia Blood MD [facility] Pager [phone]  If 7PM-7AM, please contact night-coverage [email or web address] Password [facility]  08/08/2019, 11:39 PM

## 2019-08-08 NOTE — Telephone Encounter (Signed)
LVM informing daughter patient's labs are normal. Left # for questions.

## 2019-08-08 NOTE — Progress Notes (Signed)
I received stat MRI results from radiology.  Acute to subacute left superior temporal ischemic infarction, suspect embolic etiology.  Patient at [facility], MRI scanner now.  I spoke with patient's daughter and son.  I recommend expedited work-up in the hospital to rule out embolic etiologies.  Patient will go to [facility] emergency room for admission and stroke work-up.   Suanne Marker, MD 08/08/2019, 4:24 PM Certified in Neurology, Neurophysiology and Neuroimaging  [facility] [address] [place], [place] [address] [phone]

## 2019-08-09 ENCOUNTER — Inpatient Hospital Stay (HOSPITAL_COMMUNITY): Payer: Medicare Other

## 2019-08-09 DIAGNOSIS — I639 Cerebral infarction, unspecified: Secondary | ICD-10-CM

## 2019-08-09 DIAGNOSIS — I10 Essential (primary) hypertension: Secondary | ICD-10-CM

## 2019-08-09 DIAGNOSIS — I6389 Other cerebral infarction: Secondary | ICD-10-CM

## 2019-08-09 LAB — URINALYSIS, ROUTINE W REFLEX MICROSCOPIC
Bilirubin Urine: NEGATIVE
Glucose, UA: NEGATIVE mg/dL
Hgb urine dipstick: NEGATIVE
Ketones, ur: NEGATIVE mg/dL
Leukocytes,Ua: NEGATIVE
Nitrite: NEGATIVE
Protein, ur: NEGATIVE mg/dL
Specific Gravity, Urine: 1.01 (ref 1.005–1.030)
pH: 6 (ref 5.0–8.0)

## 2019-08-09 LAB — CREATININE, SERUM
Creatinine, Ser: 0.73 mg/dL (ref 0.44–1.00)
GFR calc Af Amer: >60 mL/min (ref >60)
GFR calc non Af Amer: >60 mL/min (ref >60)

## 2019-08-09 LAB — CBC
HCT: 37.4 % (ref 36.0–46.0)
Hemoglobin: 12.2 g/dL (ref 12.0–15.0)
MCH: 26.3 pg (ref 26.0–34.0)
MCHC: 32.6 g/dL (ref 30.0–36.0)
MCV: 80.8 fL (ref 80.0–100.0)
Platelets: 262 10*3/uL (ref 150–400)
RBC: 4.63 MIL/uL (ref 3.87–5.11)
RDW: 13.1 % (ref 11.5–15.5)
WBC: 6.4 10*3/uL (ref 4.0–10.5)
nRBC: 0 % (ref 0.0–0.2)

## 2019-08-09 LAB — SARS CORONAVIRUS 2 (TAT 6-24 HRS): SARS Coronavirus 2: NEGATIVE

## 2019-08-09 LAB — HIV ANTIBODY (ROUTINE TESTING W REFLEX): HIV Screen 4th Generation wRfx: NONREACTIVE

## 2019-08-09 LAB — LIPID PANEL
Cholesterol: 279 mg/dL — ABNORMAL HIGH (ref 0–200)
HDL: 74 mg/dL (ref >40)
LDL Cholesterol: 188 mg/dL — ABNORMAL HIGH (ref 0–99)
Total CHOL/HDL Ratio: 3.8 RATIO
Triglycerides: 84 mg/dL (ref <150)
VLDL: 17 mg/dL (ref 0–40)

## 2019-08-09 LAB — ECHOCARDIOGRAM COMPLETE: Weight: 2313.95 oz

## 2019-08-09 MED ORDER — ASPIRIN 325 MG PO TABS
325.0000 mg | ORAL_TABLET | Freq: Every day | ORAL | Status: DC
  Administered 2019-08-09: 11:00:00 325 mg via ORAL
  Filled 2019-08-09: qty 1

## 2019-08-09 MED ORDER — ASPIRIN 300 MG RE SUPP: 300.0000 mg | Freq: Every day | RECTAL | Status: DC

## 2019-08-09 MED ORDER — ATORVASTATIN CALCIUM 40 MG PO TABS: 40.0000 mg | ORAL_TABLET | Freq: Every day | ORAL | Status: DC

## 2019-08-09 MED ORDER — CLOPIDOGREL BISULFATE 75 MG PO TABS
75.0000 mg | ORAL_TABLET | Freq: Every day | ORAL | Status: DC
  Administered 2019-08-09 – 2019-08-10 (×2): 75 mg via ORAL
  Filled 2019-08-09 (×2): qty 1

## 2019-08-09 MED ORDER — ACETAMINOPHEN 160 MG/5ML PO SOLN: 650.0000 mg | ORAL | Status: DC | PRN

## 2019-08-09 MED ORDER — ACETAMINOPHEN 650 MG RE SUPP: 650.0000 mg | RECTAL | Status: DC | PRN

## 2019-08-09 MED ORDER — ENOXAPARIN SODIUM 40 MG/0.4ML ~~LOC~~ SOLN
40.0000 mg | Freq: Every day | SUBCUTANEOUS | Status: DC
  Administered 2019-08-09 – 2019-08-10 (×2): 40 mg via SUBCUTANEOUS
  Filled 2019-08-09 (×3): qty 0.4

## 2019-08-09 MED ORDER — ASPIRIN EC 81 MG PO TBEC
81.0000 mg | DELAYED_RELEASE_TABLET | Freq: Every day | ORAL | Status: DC
  Administered 2019-08-10: 81 mg via ORAL
  Filled 2019-08-09: qty 1

## 2019-08-09 MED ORDER — STROKE: EARLY STAGES OF RECOVERY BOOK
Freq: Once | Status: AC
  Filled 2019-08-09: qty 1

## 2019-08-09 MED ORDER — ATORVASTATIN CALCIUM 80 MG PO TABS
80.0000 mg | ORAL_TABLET | Freq: Every day | ORAL | Status: DC
  Administered 2019-08-09: 80 mg via ORAL
  Filled 2019-08-09: qty 1

## 2019-08-09 MED ORDER — SENNOSIDES-DOCUSATE SODIUM 8.6-50 MG PO TABS: 1.0000 | ORAL_TABLET | Freq: Every evening | ORAL | Status: DC | PRN

## 2019-08-09 MED ORDER — ACETAMINOPHEN 325 MG PO TABS: 650.0000 mg | ORAL_TABLET | ORAL | Status: DC | PRN

## 2019-08-09 MED ORDER — SODIUM CHLORIDE 0.9 % IV SOLN: INTRAVENOUS | Status: DC

## 2019-08-09 MED ORDER — IOHEXOL 350 MG/ML SOLN
100.0000 mL | Freq: Once | INTRAVENOUS | Status: AC | PRN
  Administered 2019-08-09: 100 mL via INTRAVENOUS

## 2019-08-09 NOTE — Progress Notes (Signed)
  Echocardiogram 2D Echocardiogram has been performed.  Burnard Hawthorne 08/09/2019, 9:32 AM

## 2019-08-09 NOTE — Progress Notes (Signed)
STROKE TEAM PROGRESS NOTE   INTERVAL HISTORY Son at bedside, served as Equities trader.  Patient still has expressive aphasia, anomia and difficulty repeating.  However follow simple commands.  No motor deficit.  MRI consistent with embolic pattern.  Vitals:   08/09/19 0808 08/09/19 1000 08/09/19 1227 08/09/19 1300  BP:  135/62  (!) 150/60  Pulse:  (!) 53  (!) 52  Resp:    18  Temp: 98 F (36.7 C)  98.8 F (37.1 C)   TempSrc: Oral  Oral   SpO2:  98%  98%  Weight:        CBC:  Recent Labs  Lab 08/04/19 1640 08/04/19 1640 08/08/19 1644 08/08/19 1644 08/08/19 1709 08/09/19 0348  WBC 5.8   < > 7.2  --   --  6.4  NEUTROABS 3.4  --  4.1  --   --   --   HGB 12.9   < > 12.3   < > 12.9 12.2  HCT 40.1   < > 38.9   < > 38.0 37.4  MCV 80.5   < > 81.7  --   --  80.8  PLT 302   < > 281  --   --  262   < > = values in this interval not displayed.    Basic Metabolic Panel:  Recent Labs  Lab 08/04/19 1640 08/04/19 1640 08/08/19 1644 08/08/19 1644 08/08/19 1709 08/09/19 0348  NA 142   < > 142  --  140  --   K 3.7   < > 4.1  --  3.9  --   CL 105   < > 108  --  106  --   CO2 26  --  25  --   --   --   GLUCOSE 165*   < > 94  --  88  --   BUN 19   < > 13  --  16  --   CREATININE 0.75   < > 0.83   < > 0.80 0.73  CALCIUM 9.7  --  9.4  --   --   --    < > = values in this interval not displayed.   Lipid Panel:     Component Value Date/Time   CHOL 279 (H) 08/09/2019 0348   TRIG 84 08/09/2019 0348   HDL 74 08/09/2019 0348   CHOLHDL 3.8 08/09/2019 0348   VLDL 17 08/09/2019 0348   LDLCALC 188 (H) 08/09/2019 0348   HgbA1c:  Lab Results  Component Value Date   HGBA1C 5.2 08/07/2019   Urine Drug Screen: No results found for: LABOPIA, COCAINSCRNUR, LABBENZ, AMPHETMU, THCU, LABBARB  Alcohol Level No results found for: ETH  IMAGING past 24 hours MR BRAIN W WO CONTRAST  Result Date: 08/08/2019 CLINICAL DATA:  69 year old female with transient confusion, aphasia. Woke on 08/04/2019  with symptoms. EXAM: MRI HEAD WITHOUT AND WITH CONTRAST TECHNIQUE: Multiplanar, multiecho pulse sequences of the brain and surrounding structures were obtained without and with intravenous contrast. CONTRAST:  6.18mL GADAVIST GADOBUTROL 1 MMOL/ML IV SOLN COMPARISON:  Head CT without contrast [facility] 08/04/2019. FINDINGS: Study is intermittently degraded by motion artifact despite repeated imaging attempts. Brain: Confluent restricted diffusion in the posterior and inferior insula, posterior left temporal lobe, and more anterior left temporal lobe white matter. Abnormal diffusion tracks cephalad in the left periatrial white matter. Infarct encompasses a 4-5 cm area. Associated T2 and FLAIR hyperintensity, T1 hypointensity. No evidence of associated hemorrhage.  No significant mass effect. No significant enhancement. No significant left deep gray nuclei involvement. No contralateral right hemisphere or posterior fossa restricted diffusion. Underlying minimal to mild for age nonspecific white matter T2 and FLAIR hyperintensity. No cortical encephalomalacia or chronic cerebral blood products identified. No abnormal intracranial enhancement. No restricted midline shift, mass effect, evidence of mass lesion, ventriculomegaly, extra-axial collection or acute intracranial hemorrhage. Cervicomedullary junction and pituitary are within normal limits. Vascular: Major intracranial vascular flow voids are preserved. Although susceptibility at the left MCA bifurcation might reflect thrombus (series 7, image 35). The major dural venous sinuses are enhancing and appear to be patent. Skull and upper cervical spine: Negative visible cervical spine and bone marrow signal. Sinuses/Orbits: Negative. Other: Mastoids are clear. Grossly normal internal auditory structures. IMPRESSION: 1. Moderate size subacute Left MCA territory infarct centered at the posterior temporal lobe and insula. No associated hemorrhage or significant  mass effect. 2. Major vascular flow voids are preserved, but susceptibility at the left MCA bifurcation raises the possibility thrombus in the vessel there. 3. Elsewhere largely unremarkable for age MRI appearance of the brain. These results will be called to the ordering clinician or representative by the Radiologist Assistant, and communication documented in the PACS or [employer]. Electronically Signed   By: Odessa Fleming M.D.   On: 08/08/2019 16:56   ECHOCARDIOGRAM COMPLETE  Result Date: 08/09/2019    ECHOCARDIOGRAM REPORT   Patient Name:   Toni Davila  Date of Exam: 08/09/2019 Medical Rec #:  [id number]  Height:       63.0 in Accession #:    [id number] Weight:       144.6 lb Date of Birth:  1950/06/02   BSA:          1.685 m Patient Age:    68 years   BP:           162/58 mmHg Patient Gender: F          HR:           50 bpm. Exam Location:  Inpatient Procedure: 2D Echo, Cardiac Doppler and Color Doppler Indications:    Stroke 434.91  History:        Patient has no prior history of Echocardiogram examinations.                 Stroke; Risk Factors:Hypertension and Non-Smoker.  Sonographer:    Renella Cunas RDCS Referring Phys: [id number] MOHAMMAD L GARBA IMPRESSIONS  1. Left ventricular ejection fraction, by estimation, is 60 to 65%. The left ventricle has normal function. The left ventricle has no regional wall motion abnormalities. Left ventricular diastolic parameters were normal.  2. Right ventricular systolic function is normal. The right ventricular size is normal. There is normal pulmonary artery systolic pressure.  3. The mitral valve is normal in structure. No evidence of mitral valve regurgitation. No evidence of mitral stenosis.  4. The aortic valve is normal in structure. Aortic valve regurgitation is not visualized. No aortic stenosis is present.  5. The inferior vena cava is normal in size with greater than 50% respiratory variability, suggesting right atrial pressure of 3 mmHg. FINDINGS  Left Ventricle: Left  ventricular ejection fraction, by estimation, is 60 to 65%. The left ventricle has normal function. The left ventricle has no regional wall motion abnormalities. The left ventricular internal cavity size was normal in size. There is  no left ventricular hypertrophy. Left ventricular diastolic parameters were normal. Normal left ventricular filling pressure. Right Ventricle: The right  ventricular size is normal. No increase in right ventricular wall thickness. Right ventricular systolic function is normal. There is normal pulmonary artery systolic pressure. The tricuspid regurgitant velocity is 2.07 m/s, and  with an assumed right atrial pressure of 3 mmHg, the estimated right ventricular systolic pressure is 20.1 mmHg. Left Atrium: Left atrial size was normal in size. Right Atrium: Right atrial size was normal in size. Pericardium: There is no evidence of pericardial effusion. Mitral Valve: The mitral valve is normal in structure. Normal mobility of the mitral valve leaflets. No evidence of mitral valve regurgitation. No evidence of mitral valve stenosis. Tricuspid Valve: The tricuspid valve is normal in structure. Tricuspid valve regurgitation is not demonstrated. No evidence of tricuspid stenosis. Aortic Valve: The aortic valve is normal in structure. Aortic valve regurgitation is not visualized. No aortic stenosis is present. Pulmonic Valve: The pulmonic valve was normal in structure. Pulmonic valve regurgitation is not visualized. No evidence of pulmonic stenosis. Aorta: The aortic root is normal in size and structure. Venous: The inferior vena cava is normal in size with greater than 50% respiratory variability, suggesting right atrial pressure of 3 mmHg. IAS/Shunts: No atrial level shunt detected by color flow Doppler.  LEFT VENTRICLE PLAX 2D LVIDd:         4.50 cm     Diastology LVIDs:         2.90 cm     LV e' lateral:   7.05 cm/s LV PW:         0.90 cm     LV E/e' lateral: 11.4 LV IVS:        0.90 cm     LV  e' medial:    5.51 cm/s LVOT diam:     1.70 cm     LV E/e' medial:  14.6 LV SV:         63 LV SV Index:   38 LVOT Area:     2.27 cm  LV Volumes (MOD) LV vol d, MOD A2C: 45.4 ml LV vol d, MOD A4C: 70.8 ml LV vol s, MOD A2C: 28.7 ml LV vol s, MOD A4C: 33.1 ml LV SV MOD A2C:     16.7 ml LV SV MOD A4C:     70.8 ml LV SV MOD BP:      25.8 ml RIGHT VENTRICLE RV S prime:     9.86 cm/s TAPSE (M-mode): 2.1 cm LEFT ATRIUM             Index       RIGHT ATRIUM           Index LA diam:        3.60 cm 2.14 cm/m  RA Area:     13.50 cm LA Vol (A2C):   28.2 ml 16.74 ml/m RA Volume:   27.10 ml  16.09 ml/m LA Vol (A4C):   15.6 ml 9.26 ml/m LA Biplane Vol: 20.8 ml 12.35 ml/m  AORTIC VALVE LVOT Vmax:   97.90 cm/s LVOT Vmean:  69.400 cm/s LVOT VTI:    0.279 m  AORTA Ao Root diam: 3.00 cm MITRAL VALVE               TRICUSPID VALVE MV Area (PHT): 2.77 cm    TR Peak grad:   17.1 mmHg MV Decel Time: 274 msec    TR Vmax:        207.00 cm/s MV E velocity: 80.60 cm/s MV A velocity: 84.00 cm/s  SHUNTS MV E/A ratio:  0.96        Systemic VTI:  0.28 m                            Systemic Diam: 1.70 cm Rachelle Hora Croitoru MD Electronically signed by Thurmon Fair MD Signature Date/Time: 08/09/2019/1:00:15 PM    Final    VAS US CAROTID (at [facility] and [facility] only)  Result Date: 08/09/2019 Carotid Arterial Duplex Study Indications:       CVA. Risk Factors:      None. Comparison Study:  No prior studies. Performing Technologist: Chanda Busing RVT  Examination Guidelines: A complete evaluation includes B-mode imaging, spectral Doppler, color Doppler, and power Doppler as needed of all accessible portions of each vessel. Bilateral testing is considered an integral part of a complete examination. Limited examinations for reoccurring indications may be performed as noted.  Right Carotid Findings: +----------+--------+--------+--------+-----------------------+--------+           PSV cm/sEDV cm/sStenosisPlaque Description     Comments  +----------+--------+--------+--------+-----------------------+--------+ CCA Prox  48      9               smooth and heterogenous         +----------+--------+--------+--------+-----------------------+--------+ CCA Distal56      13              smooth and heterogenous         +----------+--------+--------+--------+-----------------------+--------+ ICA Prox  59      14              calcific                        +----------+--------+--------+--------+-----------------------+--------+ ICA Distal61      19                                     tortuous +----------+--------+--------+--------+-----------------------+--------+ ECA       63      8                                               +----------+--------+--------+--------+-----------------------+--------+ +----------+--------+-------+--------+-------------------+           PSV cm/sEDV cmsDescribeArm Pressure (mmHG) +----------+--------+-------+--------+-------------------+ [id number]                                        +----------+--------+-------+--------+-------------------+ +---------+--------+--+--------+-+---------+ VertebralPSV cm/s36EDV cm/s8Antegrade +---------+--------+--+--------+-+---------+  Left Carotid Findings: +----------+--------+--------+--------+-----------------------+--------+           PSV cm/sEDV cm/sStenosisPlaque Description     Comments +----------+--------+--------+--------+-----------------------+--------+ CCA Prox  73      12              smooth and heterogenous         +----------+--------+--------+--------+-----------------------+--------+ CCA Distal55      11              smooth and heterogenous         +----------+--------+--------+--------+-----------------------+--------+ ICA Prox  50      11              smooth and heterogenous         +----------+--------+--------+--------+-----------------------+--------+ ICA Distal60  25                                      tortuous +----------+--------+--------+--------+-----------------------+--------+ ECA       82      7                                               +----------+--------+--------+--------+-----------------------+--------+ +----------+--------+--------+--------+-------------------+           PSV cm/sEDV cm/sDescribeArm Pressure (mmHG) +----------+--------+--------+--------+-------------------+ [id number]     25                                  +----------+--------+--------+--------+-------------------+ +---------+--------+--+--------+--+---------+ VertebralPSV cm/s58EDV cm/s14Antegrade +---------+--------+--+--------+--+---------+   Summary: Right Carotid: Velocities in the right ICA are consistent with a 1-39% stenosis. Left Carotid: Velocities in the left ICA are consistent with a 1-39% stenosis. Vertebrals: Bilateral vertebral arteries demonstrate antegrade flow. *See table(s) above for measurements and observations.  Electronically signed by Servando Snare MD on 08/09/2019 at 9:26:01 AM.    Final     PHYSICAL EXAM  Temp:  [97.9 F (36.6 C)-98.8 F (37.1 C)] 98.6 F (37 C) (04/09 1613) Pulse Rate:  [45-64] 62 (04/09 1800) Resp:  [13-20] 18 (04/09 1800) BP: (132-174)/(45-107) 141/61 (04/09 1800) SpO2:  [92 %-100 %] 99 % (04/09 1800) Weight:  [65.6 kg] 65.6 kg (04/09 0059)  General - Well nourished, well developed, in no apparent distress.  Ophthalmologic - fundi not visualized due to noncooperation.  Cardiovascular - Regular rhythm and rate.  Neuro - awake alert, following simple commands.  However, expressive aphasia, naming 1 out of 4, not able to repeat sentences.  PERRL, EOMI, right lower quadrantanopsia.  No significant facial droop, tongue midline.  Bilateral upper and lower extremity 5/5.  Sensation symmetric, bilateral finger-to-nose intact.  Gait not tested.   ASSESSMENT/PLAN Toni Davila is a 69 y.o. [place] female with no  significant past medical history of presenting to [facility] ED with confusion and word finding difficulty Sx resolved and family elevated for OP workup. OP MRI positive for stroke   Stroke:   L MCA  infarct embolic infarcts secondary to unknown source  CT head 4/4  No acute abnormality.      MRI  Moderate L MCA infarct, possible L MCA bifurcation thrombus.  CTA head & neck pending  Carotid Doppler  B ICA 1-39% stenosis, VAs antegrade   2D Echo EF 60-65%. No source of embolus   LE venous doppler pending   Recommend loop recorder to rule out A. fib.  If patient remains hospitalized until Monday, may consider loop recorder on Monday.  However, if patient is to be discharged over the weekend, will need to arrange loop recorder as outpatient  LDL 188  HgbA1c 5.2  Lovenox 40 mg sq daily for VTE prophylaxis  No antithrombotic prior to stroke sx onset, now on aspirin 81 mg daily and clopidogrel 75 mg daily. Continue DAPT x 3 weeks then aspirin alone.   Therapy recommendations:  HH PT, HH OT  Disposition:  pending   Hypertension  Home meds:  None, no hx HTN  Elevated on admission but trending down to 130-150s . Long-term BP goal normotensive  Hyperlipidemia  Home  meds:  No statin  Now on lipitor 80   LDL 188, goal < 70  Continue statin at discharge  Other Stroke Risk Factors  Advanced age  Hospital day # 1  Marvel PlanJindong Khayree Delellis, MD PhD Stroke Neurology 08/09/2019 7:44 PM    To contact Stroke Continuity provider, please refer to [email or web address]. After hours, contact General Neurology

## 2019-08-09 NOTE — Progress Notes (Signed)
Patient ID: Toni Davila, female   DOB: November 14, 1950, 69 y.o.   MRN: [id number]  PROGRESS NOTE    Toni Davila  HLK:[id number] DOB: 1950-11-01 DOA: 08/08/2019 PCP: Mila Palmer, MD   Brief Narrative:  69 year old female with history of unknown type of anemia presented on 08/08/2019 with confusion.  She was seen by neurology as an outpatient on 421 and MRI of the brain was ordered which showed subacute MCA territory infarct.  She was referred to the hospital for further work-up.  Neurology was consulted.  Assessment & Plan:   Subacute ischemic stroke -As seen on MRI of the brain.  Neurology following. -Continue aspirin and Lipitor.  Follow 2D echo. -PT/OT/SLP evaluation -No need for permissive hypertension: Symptoms have been ongoing for at least 4 to 5 days. -LDL 188.  A1c 5.2.  Hypertension -Monitor blood pressure.  If persistently remains elevated, will start antihypertensives.   DVT prophylaxis: Lovenox Code Status: Full Family Communication: Spoke to daughter at bedside on 08/09/2019 Disposition Plan: Home in 1 to 2 days once cleared by neurology and once tolerates PT  Consultants: Neurology  Procedures: None  Antimicrobials: None   Subjective: Patient seen and examined at bedside.  Poor historian.  Daughter at bedside acted like the interpreter.  No overnight fever, vomiting or worsening shortness of breath reported.  No worsening confusion.  Objective: Vitals:   08/09/19 0059 08/09/19 0240 08/09/19 0415 08/09/19 0607  BP: (!) 158/48 (!) 136/59 (!) 154/58 (!) 162/58  Pulse: (!) 50 (!) 48 (!) 55 64  Resp: 18 20 20 14   Temp: 98.4 F (36.9 C) 97.9 F (36.6 C) 97.9 F (36.6 C) 98 F (36.7 C)  TempSrc: Oral  Oral Oral  SpO2: 100% 92% 98% 96%  Weight: 65.6 kg       Intake/Output Summary (Last 24 hours) at 08/09/2019 0804 Last data filed at 08/09/2019 0419 Gross per 24 hour  Intake 191.17 ml  Output --  Net 191.17 ml   Filed Weights   08/09/19 0059  Weight: 65.6 kg     Examination:  General exam: Appears calm and comfortable.  Very poor historian.  Does not communicate much. Respiratory system: Bilateral decreased breath sounds at bases with some scattered crackles Cardiovascular system: S1 & S2 heard, intermittently bradycardic gastrointestinal system: Abdomen is nondistended, soft and nontender. Normal bowel sounds heard. Extremities: No cyanosis, clubbing, edema  Central nervous system: Awake but a poor historian.  No focal neurological deficits. Moving extremities Skin: No rashes, lesions or ulcers Psychiatry: Flat affect  Data Reviewed: I have personally reviewed following labs and imaging studies  CBC: Recent Labs  Lab 08/04/19 1640 08/08/19 1644 08/08/19 1709 08/09/19 0348  WBC 5.8 7.2  --  6.4  NEUTROABS 3.4 4.1  --   --   HGB 12.9 12.3 12.9 12.2  HCT 40.1 38.9 38.0 37.4  MCV 80.5 81.7  --  80.8  PLT 302 281  --  262   Basic Metabolic Panel: Recent Labs  Lab 08/04/19 1640 08/08/19 1644 08/08/19 1709 08/09/19 0348  NA 142 142 140  --   K 3.7 4.1 3.9  --   CL 105 108 106  --   CO2 26 25  --   --   GLUCOSE 165* 94 88  --   BUN 19 13 16   --   CREATININE 0.75 0.83 0.80 0.73  CALCIUM 9.7 9.4  --   --    GFR: Estimated Creatinine Clearance: 60.5 mL/min (by C-G formula based on  SCr of 0.73 mg/dL). Liver Function Tests: Recent Labs  Lab 08/04/19 1640 08/08/19 1644  AST 33 29  ALT 29 29  ALKPHOS 53 49  BILITOT 0.7 0.5  PROT 7.7 7.4  ALBUMIN 4.4 4.1   No results for input(s): LIPASE, AMYLASE in the last 168 hours. No results for input(s): AMMONIA in the last 168 hours. Coagulation Profile: Recent Labs  Lab 08/04/19 1640 08/08/19 1644  INR 1.0 1.0   Cardiac Enzymes: No results for input(s): CKTOTAL, CKMB, CKMBINDEX, TROPONINI in the last 168 hours. BNP (last 3 results) No results for input(s): PROBNP in the last 8760 hours. HbA1C: Recent Labs    08/07/19 1451  HGBA1C 5.2   CBG: Recent Labs  Lab  08/04/19 1640  GLUCAP 168*   Lipid Profile: Recent Labs    08/09/19 0348  CHOL 279*  HDL 74  LDLCALC 188*  TRIG 84  CHOLHDL 3.8   Thyroid Function Tests: Recent Labs    08/07/19 1451  TSH 0.821   Anemia Panel: Recent Labs    08/07/19 1451  VITAMINB12 802   Sepsis Labs: No results for input(s): PROCALCITON, LATICACIDVEN in the last 168 hours.  Recent Results (from the past 240 hour(s))  SARS CORONAVIRUS 2 (TAT 6-24 HRS) Nasopharyngeal Nasopharyngeal Swab     Status: None   Collection Time: 08/08/19 11:11 PM   Specimen: Nasopharyngeal Swab  Result Value Ref Range Status   SARS Coronavirus 2 NEGATIVE NEGATIVE Final    Comment: (NOTE) SARS-CoV-2 target nucleic acids are NOT DETECTED. The SARS-CoV-2 RNA is generally detectable in upper and lower respiratory specimens during the acute phase of infection. Negative results do not preclude SARS-CoV-2 infection, do not rule out co-infections with other pathogens, and should not be used as the sole basis for treatment or other patient management decisions. Negative results must be combined with clinical observations, patient history, and epidemiological information. The expected result is Negative. Fact Sheet for Patients: SugarRoll.be Fact Sheet for Healthcare Providers: https://www.woods-mathews.com/ This test is not yet approved or cleared by the [place] FDA and  has been authorized for detection and/or diagnosis of SARS-CoV-2 by FDA under an Emergency Use Authorization (EUA). This EUA will remain  in effect (meaning this test can be used) for the duration of the COVID-19 declaration under Section 56 4(b)(1) of the Act, 21 U.S.C. section 360bbb-3(b)(1), unless the authorization is terminated or revoked sooner. Performed at [facility], [place] [address] [place], [place] [address]          Radiology Studies: MR BRAIN W WO CONTRAST  Result Date:  08/08/2019 CLINICAL DATA:  69 year old female with transient confusion, aphasia. Woke on 08/04/2019 with symptoms. EXAM: MRI HEAD WITHOUT AND WITH CONTRAST TECHNIQUE: Multiplanar, multiecho pulse sequences of the brain and surrounding structures were obtained without and with intravenous contrast. CONTRAST:  6.54mL GADAVIST GADOBUTROL 1 MMOL/ML IV SOLN COMPARISON:  Head CT without contrast [facility] 08/04/2019. FINDINGS: Study is intermittently degraded by motion artifact despite repeated imaging attempts. Brain: Confluent restricted diffusion in the posterior and inferior insula, posterior left temporal lobe, and more anterior left temporal lobe white matter. Abnormal diffusion tracks cephalad in the left periatrial white matter. Infarct encompasses a 4-5 cm area. Associated T2 and FLAIR hyperintensity, T1 hypointensity. No evidence of associated hemorrhage. No significant mass effect. No significant enhancement. No significant left deep gray nuclei involvement. No contralateral right hemisphere or posterior fossa restricted diffusion. Underlying minimal to mild for age nonspecific white matter T2 and  FLAIR hyperintensity. No cortical encephalomalacia or chronic cerebral blood products identified. No abnormal intracranial enhancement. No restricted midline shift, mass effect, evidence of mass lesion, ventriculomegaly, extra-axial collection or acute intracranial hemorrhage. Cervicomedullary junction and pituitary are within normal limits. Vascular: Major intracranial vascular flow voids are preserved. Although susceptibility at the left MCA bifurcation might reflect thrombus (series 7, image 35). The major dural venous sinuses are enhancing and appear to be patent. Skull and upper cervical spine: Negative visible cervical spine and bone marrow signal. Sinuses/Orbits: Negative. Other: Mastoids are clear. Grossly normal internal auditory structures. IMPRESSION: 1. Moderate size subacute Left MCA territory  infarct centered at the posterior temporal lobe and insula. No associated hemorrhage or significant mass effect. 2. Major vascular flow voids are preserved, but susceptibility at the left MCA bifurcation raises the possibility thrombus in the vessel there. 3. Elsewhere largely unremarkable for age MRI appearance of the brain. These results will be called to the ordering clinician or representative by the Radiologist Assistant, and communication documented in the PACS or [employer]. Electronically Signed   By: Odessa Fleming M.D.   On: 08/08/2019 16:56        Scheduled Meds: . aspirin  300 mg Rectal Daily   Or  . aspirin  325 mg Oral Daily  . atorvastatin  40 mg Oral q1800  . enoxaparin (LOVENOX) injection  40 mg Subcutaneous Daily  . sodium chloride flush  3 mL Intravenous Once   Continuous Infusions: . sodium chloride 75 mL/hr at 08/09/19 0419          Glade Lloyd, MD [facility] 08/09/2019, 8:04 AM

## 2019-08-09 NOTE — Progress Notes (Signed)
Physician made aware of patient heart rate trending sinus brady.

## 2019-08-09 NOTE — Social Work (Signed)
CSW met with pt bedside. Pt son was able to translate. CSW completed sbirt with pt. Pt denied alcohol use. Pt does not need resources at this time.   Toni Davila, Latanya Presser, [place] Social Worker [phone]

## 2019-08-09 NOTE — Progress Notes (Signed)
Carotid artery duplex has been completed. Preliminary results can be found in CV Proc through chart review.   08/09/19 9:14 AM Olen Cordial RVT

## 2019-08-09 NOTE — Evaluation (Signed)
Speech Language Pathology Evaluation Patient Details Name: Toni Davila MRN: [id number] DOB: May 17, 1950 Today's Date: 08/09/2019 Time: 9675-9163 SLP Time Calculation (min) (ACUTE ONLY): 33 min  Problem List:  Patient Active Problem List   Diagnosis Date Noted  . Benign essential HTN 08/08/2019  . Acute ischemic left MCA stroke (HCC) 08/08/2019   Past Medical History:  Past Medical History:  Diagnosis Date  . Anemia   . TIA (transient ischemic attack)    Past Surgical History: No past surgical history on file. HPI:  69 y.o. Toni Davila female with no significant past medical history who came for an evaluation to the emergency room after being sent by the outpatient neurology clinic when her MRI that was done for symptoms of confusion and speech problems revealed a left MCA territory stroke. Symptoms first started around 08/03/19.  PTA, pt was independent with normal cognition and language. 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 She speaks [place], a dialect of [place]  Toni Davila (who has translated for the [employer] community for many years), has been present for much of his mother's admission.    Assessment / Plan / Recommendation Clinical Impression  Pt participated in aphasia assessment with her son, Toni Davila, present for translation.  She demonstrates an expressive > receptive aphasia with generally fluent output; impairments in naming to confrontation; difficulty completing automatic sequences (DOW, MOT) with repetitions and some perseverations.  She is able to follow one-step commands and answered basic yes/no questions with 80% accuracy.  Pt will benefit from [facility] SLP upon D/C.  She will need 24/7 supervision initially.  Discussed results, nature of aphasia, and recommendations with Toni Davila, who agrees with plan.  SLP will follow while in acute care.     SLP Assessment  SLP Recommendation/Assessment: Patient needs continued Speech Lanaguage Pathology  Services SLP Visit Diagnosis: Aphasia (R47.01)    Follow Up Recommendations  Home health SLP    Frequency and Duration min 2x/week  1 week      SLP Evaluation Cognition  Overall Cognitive Status: Impaired/Different from baseline Arousal/Alertness: Awake/alert Orientation Level: Oriented to person       Comprehension  Auditory Comprehension Overall Auditory Comprehension: Impaired Yes/No Questions: Impaired Basic Immediate Environment Questions: 75-100% accurate Commands: Impaired One Step Basic Commands: 75-100% accurate Two Step Basic Commands: 50-74% accurate Reading Comprehension Reading Status: Not tested    Expression Verbal Expression Overall Verbal Expression: Impaired Initiation: No impairment Automatic Speech: Counting;Day of week;Month of year Level of Generative/Spontaneous Verbalization: Sentence Naming: Impairment Responsive: Not tested Confrontation: Impaired Convergent: 25-49% accurate Verbal Errors: Semantic paraphasias Written Expression Dominant Hand: Right Written Expression: Not tested   Oral / Motor  Oral Motor/Sensory Function Overall Oral Motor/Sensory Function: Within functional limits Motor Speech Overall Motor Speech: Appears within functional limits for tasks assessed   GO                    Toni Davila 08/09/2019, 3:06 PM  Toni Davila L. Toni Frederic, MA CCC/SLP Acute Rehabilitation Services Office number [phone] Pager [phone]

## 2019-08-09 NOTE — Progress Notes (Signed)
Pt admitted to the unit from ED. Pt alert and verbally responsive but remains confused and oriented to self. telemetry applied and verified with CCMD with NT, skin clean, dry and intact with no pressure ulcer or opened wounds noted; skin assessment completed with second RN Marchelle Folks. Pt VSS; family at bedside for safety. Bed alarm and call light within reach. Telemetry called to report pt HR down to 38, MD paged and notified with no new orders received. Will continue to closely monitor. Arabella Merles Desteny Freeman RN     08/09/19 0059  Vitals  Temp 98.4 F (36.9 C)  Temp Source Oral  BP (!) 158/48  MAP (mmHg) 80  BP Location Left Arm  BP Method Automatic  Patient Position (if appropriate) Lying  Pulse Rate (!) 50  Pulse Rate Source Monitor  Resp 18  Level of Consciousness  Level of Consciousness Alert  Oxygen Therapy  SpO2 100 %  O2 Device Room Air  Pain Assessment  Pain Scale 0-10  Pain Score 0  MEWS Score  MEWS Temp 0  MEWS Systolic 0  MEWS Pulse 1  MEWS RR 0  MEWS LOC 0  MEWS Score 1  MEWS Score Color Green

## 2019-08-09 NOTE — Evaluation (Signed)
Physical Therapy Evaluation Patient Details Name: Toni Davila MRN: [id number] DOB: [birth year] Today's Date: 08/09/2019   History of Present Illness  69 yo female admitted to ED initially on 4/4 for memory loss, but d/c same day. Readmitted to ED on 4/8 aphasic speech, recent fall, and confusion. Imaging reveals L MCA infarct, specifically of temporal region. PMH includes anemia, TIAs.  Clinical Impression   Pt presents with good functional strength, increased time and effort to mobilize, expressive and mild receptive difficulties, mild R inattention, and decreased activity tolerance post-CVA. Pt to benefit from acute PT to address deficits. Pt ambulated hallway distance with min guard for safety with slow and steady gait. Per daughter, pt is independent at baseline. PT recommending HHPT to address deficits post-acutely, has a great support system in family. PT to progress mobility as tolerated, and will continue to follow acutely.      Follow Up Recommendations Home health PT;Supervision for mobility/OOB    Equipment Recommendations  None recommended by PT    Recommendations for Other Services       Precautions / Restrictions Precautions Precautions: Fall Restrictions Weight Bearing Restrictions: No      Mobility  Bed Mobility Overal bed mobility: Needs Assistance Bed Mobility: Supine to Sit     Supine to sit: Min guard;HOB elevated     General bed mobility comments: min guard for safety, increased time with use of bedrail.  Transfers Overall transfer level: Needs assistance Equipment used: None Transfers: Sit to/from Stand Sit to Stand: Min guard;From elevated surface         General transfer comment: for safety, initially rose and abruptly sat back down suspect due to pt thinking she was not supposed to stand yet. On second attempt, rose and steadied self without difficulty.  Ambulation/Gait Ambulation/Gait assistance: Min guard;+2 safety/equipment Gait Distance  (Feet): 170 Feet Assistive device: None Gait Pattern/deviations: Step-through pattern;Decreased stride length Gait velocity: decr   General Gait Details: min guard for safety, slow and steady gait. Pt somewhat distracted by hallway ambulation, but attends to both L and R during gait.  Stairs            Wheelchair Mobility    Modified Rankin (Stroke Patients Only) Modified Rankin (Stroke Patients Only) Pre-Morbid Rankin Score: No symptoms Modified Rankin: Moderately severe disability     Balance Overall balance assessment: Needs assistance;History of Falls Sitting-balance support: No upper extremity supported Sitting balance-Leahy Scale: Good     Standing balance support: No upper extremity supported Standing balance-Leahy Scale: Fair Standing balance comment: ambulates without external support                             Pertinent Vitals/Pain Pain Assessment: Faces Faces Pain Scale: Hurts a little bit Pain Location: L shoulder, due to lovenox injection Pain Descriptors / Indicators: Sore Pain Intervention(s): Limited activity within patient's tolerance;Monitored during session;Repositioned    Home Living Family/patient expects to be discharged to:: Private residence Living Arrangements: Other relatives Available Help at Discharge: Family Type of Home: Nurse, adult of Steps: unsure Home Layout: One level Home Equipment: None      Prior Function Level of Independence: Independent         Comments: PTA, pt was ambulatory without AD, independent in self-care. Pt enjoys gardening, singing in church choir. Pt driving PTA, but grandson is available to drive her as needed.     Hand Dominance   Dominant  Hand: Right    Extremity/Trunk Assessment   Upper Extremity Assessment Upper Extremity Assessment: Overall WFL for tasks assessed    Lower Extremity Assessment Lower Extremity Assessment: Overall WFL for tasks assessed     Cervical / Trunk Assessment Cervical / Trunk Assessment: Normal  Communication   Communication: Expressive difficulties;Prefers language other than English;Other (comment);Receptive difficulties  Cognition Arousal/Alertness: Awake/alert Behavior During Therapy: WFL for tasks assessed/performed Overall Cognitive Status: Impaired/Different from baseline Area of Impairment: Orientation;Attention                 Orientation Level: Disoriented to;Situation Current Attention Level: Selective           General Comments: Pt oriented to self and hospital, not situation. Attends to whoever is speaking in room, follows short commands well. Pt with mild R sided inattention when given stimulation on left, but orients with verbal and tactile cuing. Word finding difficulties, does better when given options, i.e. is this toothbrush yellow or blue? Difficult to fully assesss cognition due to language barrier as well as mild receptive difficulties secondary to CVA.      General Comments      Exercises     Assessment/Plan    PT Assessment Patient needs continued PT services  PT Problem List Decreased mobility;Decreased activity tolerance;Decreased balance;Decreased knowledge of precautions;Decreased safety awareness       PT Treatment Interventions DME instruction;Therapeutic activities;Gait training;Therapeutic exercise;Patient/family education;Balance training;Stair training;Functional mobility training    PT Goals (Current goals can be found in the Care Plan section)  Acute Rehab PT Goals Patient Stated Goal: my mother to go home PT Goal Formulation: With family Time For Goal Achievement: 08/23/19 Potential to Achieve Goals: Good    Frequency Min 3X/week   Barriers to discharge        Co-evaluation PT/OT/SLP Co-Evaluation/Treatment: Yes Reason for Co-Treatment: For patient/therapist safety PT goals addressed during session: Mobility/safety with mobility          AM-PAC PT "6 Clicks" Mobility  Outcome Measure Help needed turning from your back to your side while in a flat bed without using bedrails?: A Little Help needed moving from lying on your back to sitting on the side of a flat bed without using bedrails?: A Little Help needed moving to and from a bed to a chair (including a wheelchair)?: A Little Help needed standing up from a chair using your arms (e.g., wheelchair or bedside chair)?: A Little Help needed to walk in hospital room?: A Little Help needed climbing 3-5 steps with a railing? : A Little 6 Click Score: 18    End of Session Equipment Utilized During Treatment: Gait belt Activity Tolerance: Patient tolerated treatment well Patient left: in chair;with call bell/phone within reach;with chair alarm set;with family/visitor present Nurse Communication: Mobility status PT Visit Diagnosis: Other abnormalities of gait and mobility (R26.89);Other symptoms and signs involving the nervous system (R29.898)    Time: 1610-9604 PT Time Calculation (min) (ACUTE ONLY): 27 min   Charges:   PT Evaluation $PT Eval Low Complexity: 1 Low          Shirley Bolle E, PT Acute Rehabilitation Services Pager [phone]  Office [phone]   Chantavia Bazzle D Elonda Husky 08/09/2019, 2:27 PM

## 2019-08-09 NOTE — Evaluation (Signed)
Occupational Therapy Evaluation Patient Details Name: Toni Davila MRN: [id number] DOB: Jul 31, 1950 Today's Date: 08/09/2019    History of Present Illness 69 yo female admitted to ED initially on 4/4 for memory loss, but d/c same day. Readmitted to ED on 4/8 aphasic speech, recent fall, and confusion. Imaging reveals L MCA infarct, specifically of temporal region. PMH includes anemia, TIAs.   Clinical Impression   Patient presenting with decreased I in self care, balance, functional mobility/transfers, endurance, safety awareness, R inattention, language difficulties and cognition. Patient independent PTA and living at home with grandson. She was driving and cooking prior.  Patient currently functioning CGA for balance with self care and functional mobility without use of AD. Pt with greater difficulty with cognition and word finding during this session. Pt's daughter present to interpret this session.  Patient will benefit from acute OT to increase overall independence in the areas of ADLs, functional mobility, and safety awareness in order to safely discharge home with caregivers.    Follow Up Recommendations  Home health OT;Supervision/Assistance - 24 hour    Equipment Recommendations  None recommended by OT       Precautions / Restrictions Precautions Precautions: Fall Restrictions Weight Bearing Restrictions: No      Mobility Bed Mobility Overal bed mobility: Needs Assistance Bed Mobility: Supine to Sit     Supine to sit: Min guard;HOB elevated     General bed mobility comments: min guard for safety, increased time with use of bedrail.  Transfers Overall transfer level: Needs assistance Equipment used: None Transfers: Sit to/from Stand Sit to Stand: Min guard;From elevated surface         General transfer comment: for safety, initially rose and abruptly sat back down suspect due to pt thinking she was not supposed to stand yet. On second attempt, rose and steadied self  without difficulty.    Balance Overall balance assessment: History of Falls;Needs assistance Sitting-balance support: No upper extremity supported Sitting balance-Leahy Scale: Good     Standing balance support: No upper extremity supported Standing balance-Leahy Scale: Fair Standing balance comment: ambulates without external support         ADL either performed or assessed with clinical judgement   ADL Overall ADL's : Needs assistance/impaired     Grooming: Wash/dry hands;Wash/dry face;Oral care;Standing;Min guard Grooming Details (indicate cue type and reason): standing at sink for several minutes to complete tasks      Lower Body Dressing: Min guard Lower Body Dressing Details (indicate cue type and reason): B socks          Vision Baseline Vision/History: Wears glasses Wears Glasses: Reading only Patient Visual Report: No change from baseline Vision Assessment?: Vision impaired- to be further tested in functional context;Yes Eye Alignment: Within Functional Limits Tracking/Visual Pursuits: Able to track stimulus in all quads without difficulty Visual Fields: No apparent deficits            Pertinent Vitals/Pain Pain Assessment: Faces Faces Pain Scale: Hurts a little bit Pain Location: L shoulder, due to lovenox injection Pain Descriptors / Indicators: Sore Pain Intervention(s): Limited activity within patient's tolerance;Monitored during session;Repositioned     Hand Dominance Right   Extremity/Trunk Assessment Upper Extremity Assessment Upper Extremity Assessment: Overall WFL for tasks assessed   Lower Extremity Assessment Lower Extremity Assessment: Overall WFL for tasks assessed   Cervical / Trunk Assessment Cervical / Trunk Assessment: Normal   Communication Communication Communication: Expressive difficulties;Prefers language other than English;Other (comment);Receptive difficulties   Cognition Arousal/Alertness: Awake/alert Behavior During  Therapy: WFL for tasks assessed/performed Overall Cognitive Status: Impaired/Different from baseline Area of Impairment: Orientation;Attention      Orientation Level: Disoriented to;Situation Current Attention Level: Selective     General Comments: Pt oriented to self and hospital, not situation. Attends to whoever is speaking in room, follows short commands well. Pt with mild R sided inattention when given stimulation on left, but orients with verbal and tactile cuing. Word finding difficulties, does better when given options, i.e. is this toothbrush yellow or blue? Difficult to fully assesss cognition due to language barrier as well as mild receptive difficulties secondary to CVA.   General Comments  min guard for balance with standing tasks. No use of AD this session            Home Living Family/patient expects to be discharged to:: Private residence Living Arrangements: Other relatives Available Help at Discharge: Family Type of Home: Nutritional therapist of Steps: unsure   Home Layout: One level     Bathroom Shower/Tub: Chief Strategy Officer: Standard     Home Equipment: None      Lives With: Other (Comment)(nephew)    Prior Functioning/Environment Level of Independence: Independent        Comments: PTA, pt was ambulatory without AD, independent in self-care. Pt enjoys gardening, singing in church choir. Pt driving PTA, but grandson is available to drive her as needed.        OT Problem List: Impaired vision/perception;Impaired balance (sitting and/or standing);Decreased safety awareness;Decreased activity tolerance;Decreased cognition      OT Treatment/Interventions: Self-care/ADL training;Therapeutic exercise;Therapeutic activities;Neuromuscular education;Cognitive remediation/compensation;Energy conservation;Visual/perceptual remediation/compensation;DME and/or AE instruction;Patient/family education;Balance training    OT  Goals(Current goals can be found in the care plan section) Acute Rehab OT Goals Patient Stated Goal: my mother to go home OT Goal Formulation: With family Time For Goal Achievement: 08/23/19 Potential to Achieve Goals: Good ADL Goals Pt Will Perform Grooming: with supervision Pt Will Perform Upper Body Bathing: with supervision Pt Will Perform Lower Body Bathing: with supervision Pt Will Perform Upper Body Dressing: with supervision Pt Will Perform Lower Body Dressing: with supervision Pt Will Transfer to Toilet: with supervision Pt Will Perform Toileting - Clothing Manipulation and hygiene: with supervision Pt Will Perform Tub/Shower Transfer: with supervision  OT Frequency: Min 2X/week   Barriers to D/C:    none known at this time       Co-evaluation   Reason for Co-Treatment: For patient/therapist safety PT goals addressed during session: Mobility/safety with mobility        AM-PAC OT "6 Clicks" Daily Activity     Outcome Measure Help from another person eating meals?: None Help from another person taking care of personal grooming?: A Little Help from another person toileting, which includes using toliet, bedpan, or urinal?: A Little Help from another person bathing (including washing, rinsing, drying)?: A Little Help from another person to put on and taking off regular upper body clothing?: None Help from another person to put on and taking off regular lower body clothing?: A Little 6 Click Score: 20   End of Session Equipment Utilized During Treatment: Gait belt Nurse Communication: Mobility status;Precautions  Activity Tolerance: Patient tolerated treatment well Patient left: in chair;with call bell/phone within reach;with chair alarm set;with family/visitor present  OT Visit Diagnosis: Other symptoms and signs involving the nervous system (R29.898);Cognitive communication deficit (O53.664)                Time: 4034-7425 OT Time Calculation (  min): 27 min Charges:   OT General Charges $OT Visit: 1 Visit OT Evaluation $OT Eval Low Complexity: 1 Low   Keirston Saephanh P MS, OTR/L 08/09/2019, 2:32 PM

## 2019-08-09 NOTE — Plan of Care (Signed)
  Problem: Education: Goal: Knowledge of disease or condition will improve Outcome: Progressing   

## 2019-08-10 ENCOUNTER — Inpatient Hospital Stay (HOSPITAL_COMMUNITY): Payer: Medicare Other

## 2019-08-10 DIAGNOSIS — I639 Cerebral infarction, unspecified: Secondary | ICD-10-CM

## 2019-08-10 DIAGNOSIS — I63512 Cerebral infarction due to unspecified occlusion or stenosis of left middle cerebral artery: Principal | ICD-10-CM

## 2019-08-10 LAB — CBC WITH DIFFERENTIAL/PLATELET
Abs Immature Granulocytes: 0.01 10*3/uL (ref 0.00–0.07)
Basophils Absolute: 0 10*3/uL (ref 0.0–0.1)
Basophils Relative: 1 %
Eosinophils Absolute: 0.3 10*3/uL (ref 0.0–0.5)
Eosinophils Relative: 5 %
HCT: 36.2 % (ref 36.0–46.0)
Hemoglobin: 11.8 g/dL — ABNORMAL LOW (ref 12.0–15.0)
Immature Granulocytes: 0 %
Lymphocytes Relative: 45 %
Lymphs Abs: 2.5 10*3/uL (ref 0.7–4.0)
MCH: 25.9 pg — ABNORMAL LOW (ref 26.0–34.0)
MCHC: 32.6 g/dL (ref 30.0–36.0)
MCV: 79.6 fL — ABNORMAL LOW (ref 80.0–100.0)
Monocytes Absolute: 0.4 10*3/uL (ref 0.1–1.0)
Monocytes Relative: 8 %
Neutro Abs: 2.2 10*3/uL (ref 1.7–7.7)
Neutrophils Relative %: 41 %
Platelets: 264 10*3/uL (ref 150–400)
RBC: 4.55 MIL/uL (ref 3.87–5.11)
RDW: 12.9 % (ref 11.5–15.5)
WBC: 5.5 10*3/uL (ref 4.0–10.5)
nRBC: 0 % (ref 0.0–0.2)

## 2019-08-10 LAB — BASIC METABOLIC PANEL
Anion gap: 11 (ref 5–15)
BUN: 9 mg/dL (ref 8–23)
CO2: 22 mmol/L (ref 22–32)
Calcium: 9.1 mg/dL (ref 8.9–10.3)
Chloride: 107 mmol/L (ref 98–111)
Creatinine, Ser: 0.75 mg/dL (ref 0.44–1.00)
GFR calc Af Amer: >60 mL/min (ref >60)
GFR calc non Af Amer: >60 mL/min (ref >60)
Glucose, Bld: 77 mg/dL (ref 70–99)
Potassium: 3.8 mmol/L (ref 3.5–5.1)
Sodium: 140 mmol/L (ref 135–145)

## 2019-08-10 LAB — HEMOGLOBIN A1C
Hgb A1c MFr Bld: 5.4 % (ref 4.8–5.6)
Mean Plasma Glucose: 108 mg/dL

## 2019-08-10 LAB — MAGNESIUM: Magnesium: 2.1 mg/dL (ref 1.7–2.4)

## 2019-08-10 MED ORDER — CLOPIDOGREL BISULFATE 75 MG PO TABS: 75.0000 mg | ORAL_TABLET | Freq: Every day | ORAL | 0 refills | Status: AC

## 2019-08-10 MED ORDER — ATORVASTATIN CALCIUM 80 MG PO TABS: 80.0000 mg | ORAL_TABLET | Freq: Every day | ORAL | 0 refills | Status: AC

## 2019-08-10 NOTE — Progress Notes (Signed)
Telemetry called to report pt HR dropping down to the 30's with the lowest 34. Pt asymptomatic and sleeping with grandson sitting at bedside. NP on-call paged and notified. No orders received, awaiting on response from NP on call. Will continue to closely monitor. Dionne Bucy RN

## 2019-08-10 NOTE — Discharge Summary (Signed)
Physician Discharge Summary  Airis Barbee FUX:[id number] DOB: 09-Feb-1951 DOA: 08/08/2019  PCP: Mila Palmer, MD  Admit date: 08/08/2019 Discharge date: 08/10/2019  Admitted From: Home Disposition: Home  Recommendations for Outpatient Follow-up:  1. Follow up with PCP in 1 week with repeat CBC/BMP 2. Outpatient follow-up with neurology 3. Outpatient follow-up with cardiology for arrangement for loop recorder placement 4. Follow up in ED if symptoms worsen or new appear   Home Health: PT/OT/SLP Equipment/Devices: None  Discharge Condition: Stable CODE STATUS: Full Diet recommendation: Heart healthy/diet as per SLP recommendations  Brief/Interim Summary: 69 year old female with history of unknown type of anemia presented on 08/08/2019 with confusion.  She was seen by neurology as an outpatient on 421 and MRI of the brain was ordered which showed subacute MCA territory infarct.  She was referred to the hospital for further work-up.  Neurology was consulted.  During hospitalization, neurology recommended aspirin and Plavix for 3 weeks then subsequently aspirin alone.  Neurology also recommended loop recorder placement.  This will be arranged as an outpatient.  Patient has tolerated PT/OT and is tolerating diet.  She will be discharged home today with home health PT/OT/SLP.  Discharge Diagnoses:   Subacute ischemic stroke -As seen on MRI of the brain.   -Neurology was consulted: recommended aspirin and Plavix for 3 weeks then subsequently aspirin alone.  Neurology also recommended loop recorder placement.  This will be arranged as an outpatient.  Neurology has signed off.  Also continue Lipitor 80 mg daily. -Patient has tolerated PT/OT and is tolerating diet.  She will be discharged home today with home health PT/OT/SLP. -LDL 188.  A1c 5.2. -Echo showed EF of 60 to 65%.  Bilateral carotid ultrasound showed 1 to 39% stenosis.  Hypertension -Monitor blood pressure.  If persistently remains  elevated, antihypertensives will need to be started as an outpatient.  Discharge Instructions  Discharge Instructions    Ambulatory referral to Cardiology   Complete by: As directed    Arrangement of loop recorder as per neuro recommendations   Ambulatory referral to Neurology   Complete by: As directed    An appointment is requested in approximately: few weeks   Diet - low sodium heart healthy   Complete by: As directed    Increase activity slowly   Complete by: As directed      Allergies as of 08/10/2019   No Known Allergies     Medication List    TAKE these medications   aspirin 81 MG chewable tablet Chew 1 tablet (81 mg total) by mouth daily.   atorvastatin 80 MG tablet Commonly known as: LIPITOR Take 1 tablet (80 mg total) by mouth daily at 6 PM.   clopidogrel 75 MG tablet Commonly known as: PLAVIX Take 1 tablet (75 mg total) by mouth daily.   ibuprofen 200 MG tablet Commonly known as: ADVIL Take 200 mg by mouth every 6 (six) hours as needed for headache or mild pain.      Follow-up Information    Mila Palmer, MD. Schedule an appointment as soon as possible for a visit in 1 week(s).   Specialty: Family Medicine Contact information: [address] [place] [address] [phone]          No Known Allergies  Consultations:     Procedures/Studies: CT ANGIO HEAD W OR WO CONTRAST  Result Date: 08/09/2019 CLINICAL DATA:  69 year old female with recent confusion and aphasia beginning on 08/04/2019 found to have left MCA territory infarct on  MRI yesterday. Susceptibility at the left MCA bifurcation on that exam. EXAM: CT ANGIOGRAPHY HEAD AND NECK TECHNIQUE: Multidetector CT imaging of the head and neck was performed using the standard protocol during bolus administration of intravenous contrast. Multiplanar CT image reconstructions and MIPs were obtained to evaluate the vascular anatomy. Carotid stenosis measurements (when  applicable) are obtained utilizing NASCET criteria, using the distal internal carotid diameter as the denominator. CONTRAST:  OMNIPAQUE IOHEXOL 350 MG/ML SOLN COMPARISON:  Brain MRI 08/08/2019.  Head CT 08/04/2019. FINDINGS: CT HEAD Brain: Posterior left insula, temporal lobe cytotoxic edema corresponding to the DWI abnormality yesterday. No acute intracranial hemorrhage identified. No midline shift or intracranial mass effect. Elsewhere gray-white matter differentiation appears stable since 08/04/2019. No ventriculomegaly. Calvarium and skull base: No acute osseous abnormality identified. Paranasal sinuses: Visualized paranasal sinuses and mastoids are stable and well pneumatized. Orbits: Visualized orbits and scalp soft tissues are within normal limits. CTA NECK Skeleton: No acute osseous abnormality identified. Mild for age cervical spine degeneration. Scattered dental caries. Upper chest: Negative. Other neck: 11 mm hypodense left thyroid nodule, Not clinically significant; no follow-up imaging recommended (ref: J Am Coll Radiol. 2015 Feb;12(2): 143-50).Otherwise negative neck soft tissues. Aortic arch: 3 vessel arch configuration. Mild to moderate arch atherosclerosis. Right carotid system: Negative brachiocephalic artery and right CCA origin. Soft and calcified plaque in the medial vessel at the level of the larynx, no stenosis. Calcified plaque at the bifurcation, right ICA origin and bulb is fairly mild with no stenosis. Mild cervical right ICA tortuosity. Left carotid system: Left CCA origin mild atherosclerosis without stenosis. Mild soft plaque in the vessel proximal to the bifurcation. Mild soft and calcified plaque at the bifurcation, left ICA origin and bulb without stenosis. Tortuous left ICA with a partially retropharyngeal course distal to the bulb. Vertebral arteries: Normal proximal right subclavian artery. Calcified plaque at the right vertebral artery origin with moderate to severe  stenosis (series 12, image 125). The right vertebral is non dominant and remains diminutive but patent to the skull base. No proximal left subclavian artery stenosis despite plaque. Calcified plaque at the left vertebral artery origin but only mild stenosis (also on image 125). Dominant left vertebral artery is patent to the skull base without additional plaque or stenosis. CTA HEAD Posterior circulation: No distal vertebral artery plaque or stenosis. The left V4 is dominant. Patent left PICA and dominant appearing right AICA origins. Patent vertebrobasilar junction and basilar artery without stenosis. Patent SCA and PCA origins. Posterior communicating arteries are diminutive or absent. Mild bilateral PCA irregularity without significant stenosis. Anterior circulation: Both ICA siphons are patent. On the left moderate calcified plaque results in mild to moderate stenosis in the distal cavernous segment and anterior genu. Normal left ophthalmic artery origin. On the right moderate calcified plaque results in mild cavernous and supraclinoid segment stenosis. Patent carotid termini, MCA and ACA origins. Codominant A1 segments. Normal anterior communicating artery and bilateral ACA branches. Right MCA M1 segment and bifurcation are patent without stenosis. Right MCA branches are within normal limits. Left MCA M1 segment and bifurcation are patent. There is mild irregularity at the bifurcation but no significant stenosis. See however, I suspect a posterior M2 branch origin occlusion on series 16, image 26, although the distal vessel cannot be delineated. Other left MCA branches seen within normal limits Venous sinuses: Patent. Anatomic variants: Dominant left vertebral artery, the right is diminutive. Review of the MIP images confirms the above findings IMPRESSION: 1. Appearance suspicious for a  Left M2 branch occlusion at the bifurcation on series 16 image 26, with no branch reconstitution. No other significant Left  MCA stenosis. 2. Bilateral carotid atherosclerosis, with up to moderate left ICA siphon stenosis due to calcified plaque. Mild right siphon stenosis, and no significant stenosis in the neck. 3. Mild stenosis at the origin of the dominant left vertebral artery. Moderate to severe stenosis at the origin of the non dominant right vertebral which is diminutive. Otherwise negative posterior circulation. 4. Expected CT appearance of the recent Left MCA infarct. No hemorrhagic transformation, mass effect, or new intracranial abnormality. 5.  Aortic Atherosclerosis (ICD10-I70.0). Electronically Signed   By: Odessa Fleming M.D.   On: 08/09/2019 16:20   CT HEAD WO CONTRAST  Result Date: 08/04/2019 CLINICAL DATA:  Memory loss EXAM: CT HEAD WITHOUT CONTRAST TECHNIQUE: Contiguous axial images were obtained from the base of the skull through the vertex without intravenous contrast. COMPARISON:  None. FINDINGS: Brain: No acute intracranial abnormality. Specifically, no hemorrhage, hydrocephalus, mass lesion, acute infarction, or significant intracranial injury. Vascular: No hyperdense vessel or unexpected calcification. Skull: No acute calvarial abnormality. Sinuses/Orbits: Visualized paranasal sinuses and mastoids clear. Orbital soft tissues unremarkable. Other: None IMPRESSION: No acute intracranial abnormality. Electronically Signed   By: Charlett Nose M.D.   On: 08/04/2019 16:37   CT ANGIO NECK W OR WO CONTRAST  Result Date: 08/09/2019 CLINICAL DATA:  69 year old female with recent confusion and aphasia beginning on 08/04/2019 found to have left MCA territory infarct on MRI yesterday. Susceptibility at the left MCA bifurcation on that exam. EXAM: CT ANGIOGRAPHY HEAD AND NECK TECHNIQUE: Multidetector CT imaging of the head and neck was performed using the standard protocol during bolus administration of intravenous contrast. Multiplanar CT image reconstructions and MIPs were obtained to evaluate the vascular anatomy. Carotid  stenosis measurements (when applicable) are obtained utilizing NASCET criteria, using the distal internal carotid diameter as the denominator. CONTRAST:  OMNIPAQUE IOHEXOL 350 MG/ML SOLN COMPARISON:  Brain MRI 08/08/2019.  Head CT 08/04/2019. FINDINGS: CT HEAD Brain: Posterior left insula, temporal lobe cytotoxic edema corresponding to the DWI abnormality yesterday. No acute intracranial hemorrhage identified. No midline shift or intracranial mass effect. Elsewhere gray-white matter differentiation appears stable since 08/04/2019. No ventriculomegaly. Calvarium and skull base: No acute osseous abnormality identified. Paranasal sinuses: Visualized paranasal sinuses and mastoids are stable and well pneumatized. Orbits: Visualized orbits and scalp soft tissues are within normal limits. CTA NECK Skeleton: No acute osseous abnormality identified. Mild for age cervical spine degeneration. Scattered dental caries. Upper chest: Negative. Other neck: 11 mm hypodense left thyroid nodule, Not clinically significant; no follow-up imaging recommended (ref: J Am Coll Radiol. 2015 Feb;12(2): 143-50).Otherwise negative neck soft tissues. Aortic arch: 3 vessel arch configuration. Mild to moderate arch atherosclerosis. Right carotid system: Negative brachiocephalic artery and right CCA origin. Soft and calcified plaque in the medial vessel at the level of the larynx, no stenosis. Calcified plaque at the bifurcation, right ICA origin and bulb is fairly mild with no stenosis. Mild cervical right ICA tortuosity. Left carotid system: Left CCA origin mild atherosclerosis without stenosis. Mild soft plaque in the vessel proximal to the bifurcation. Mild soft and calcified plaque at the bifurcation, left ICA origin and bulb without stenosis. Tortuous left ICA with a partially retropharyngeal course distal to the bulb. Vertebral arteries: Normal proximal right subclavian artery. Calcified plaque at the right vertebral artery origin  with moderate to severe stenosis (series 12, image 125). The right vertebral is non dominant and  remains diminutive but patent to the skull base. No proximal left subclavian artery stenosis despite plaque. Calcified plaque at the left vertebral artery origin but only mild stenosis (also on image 125). Dominant left vertebral artery is patent to the skull base without additional plaque or stenosis. CTA HEAD Posterior circulation: No distal vertebral artery plaque or stenosis. The left V4 is dominant. Patent left PICA and dominant appearing right AICA origins. Patent vertebrobasilar junction and basilar artery without stenosis. Patent SCA and PCA origins. Posterior communicating arteries are diminutive or absent. Mild bilateral PCA irregularity without significant stenosis. Anterior circulation: Both ICA siphons are patent. On the left moderate calcified plaque results in mild to moderate stenosis in the distal cavernous segment and anterior genu. Normal left ophthalmic artery origin. On the right moderate calcified plaque results in mild cavernous and supraclinoid segment stenosis. Patent carotid termini, MCA and ACA origins. Codominant A1 segments. Normal anterior communicating artery and bilateral ACA branches. Right MCA M1 segment and bifurcation are patent without stenosis. Right MCA branches are within normal limits. Left MCA M1 segment and bifurcation are patent. There is mild irregularity at the bifurcation but no significant stenosis. See however, I suspect a posterior M2 branch origin occlusion on series 16, image 26, although the distal vessel cannot be delineated. Other left MCA branches seen within normal limits Venous sinuses: Patent. Anatomic variants: Dominant left vertebral artery, the right is diminutive. Review of the MIP images confirms the above findings IMPRESSION: 1. Appearance suspicious for a Left M2 branch occlusion at the bifurcation on series 16 image 26, with no branch reconstitution. No  other significant Left MCA stenosis. 2. Bilateral carotid atherosclerosis, with up to moderate left ICA siphon stenosis due to calcified plaque. Mild right siphon stenosis, and no significant stenosis in the neck. 3. Mild stenosis at the origin of the dominant left vertebral artery. Moderate to severe stenosis at the origin of the non dominant right vertebral which is diminutive. Otherwise negative posterior circulation. 4. Expected CT appearance of the recent Left MCA infarct. No hemorrhagic transformation, mass effect, or new intracranial abnormality. 5.  Aortic Atherosclerosis (ICD10-I70.0). Electronically Signed   By: Genevie Ann M.D.   On: 08/09/2019 16:20   MR BRAIN W WO CONTRAST  Result Date: 08/08/2019 CLINICAL DATA:  69 year old female with transient confusion, aphasia. Woke on 08/04/2019 with symptoms. EXAM: MRI HEAD WITHOUT AND WITH CONTRAST TECHNIQUE: Multiplanar, multiecho pulse sequences of the brain and surrounding structures were obtained without and with intravenous contrast. CONTRAST:  6.12mL GADAVIST GADOBUTROL 1 MMOL/ML IV SOLN COMPARISON:  Head CT without contrast [facility] 08/04/2019. FINDINGS: Study is intermittently degraded by motion artifact despite repeated imaging attempts. Brain: Confluent restricted diffusion in the posterior and inferior insula, posterior left temporal lobe, and more anterior left temporal lobe white matter. Abnormal diffusion tracks cephalad in the left periatrial white matter. Infarct encompasses a 4-5 cm area. Associated T2 and FLAIR hyperintensity, T1 hypointensity. No evidence of associated hemorrhage. No significant mass effect. No significant enhancement. No significant left deep gray nuclei involvement. No contralateral right hemisphere or posterior fossa restricted diffusion. Underlying minimal to mild for age nonspecific white matter T2 and FLAIR hyperintensity. No cortical encephalomalacia or chronic cerebral blood products identified. No abnormal  intracranial enhancement. No restricted midline shift, mass effect, evidence of mass lesion, ventriculomegaly, extra-axial collection or acute intracranial hemorrhage. Cervicomedullary junction and pituitary are within normal limits. Vascular: Major intracranial vascular flow voids are preserved. Although susceptibility at the left MCA bifurcation might reflect  thrombus (series 7, image 35). The major dural venous sinuses are enhancing and appear to be patent. Skull and upper cervical spine: Negative visible cervical spine and bone marrow signal. Sinuses/Orbits: Negative. Other: Mastoids are clear. Grossly normal internal auditory structures. IMPRESSION: 1. Moderate size subacute Left MCA territory infarct centered at the posterior temporal lobe and insula. No associated hemorrhage or significant mass effect. 2. Major vascular flow voids are preserved, but susceptibility at the left MCA bifurcation raises the possibility thrombus in the vessel there. 3. Elsewhere largely unremarkable for age MRI appearance of the brain. These results will be called to the ordering clinician or representative by the Radiologist Assistant, and communication documented in the PACS or [employer]. Electronically Signed   By: Odessa Fleming M.D.   On: 08/08/2019 16:56   ECHOCARDIOGRAM COMPLETE  Result Date: 08/09/2019    ECHOCARDIOGRAM REPORT   Patient Name:   ODELIA Harpham  Date of Exam: 08/09/2019 Medical Rec #:  [id number]  Height:       63.0 in Accession #:    [id number] Weight:       144.6 lb Date of Birth:  03/25/1951   BSA:          1.685 m Patient Age:    68 years   BP:           162/58 mmHg Patient Gender: F          HR:           50 bpm. Exam Location:  Inpatient Procedure: 2D Echo, Cardiac Doppler and Color Doppler Indications:    Stroke 434.91  History:        Patient has no prior history of Echocardiogram examinations.                 Stroke; Risk Factors:Hypertension and Non-Smoker.  Sonographer:    Renella Cunas RDCS Referring  Phys: [id number] MOHAMMAD L GARBA IMPRESSIONS  1. Left ventricular ejection fraction, by estimation, is 60 to 65%. The left ventricle has normal function. The left ventricle has no regional wall motion abnormalities. Left ventricular diastolic parameters were normal.  2. Right ventricular systolic function is normal. The right ventricular size is normal. There is normal pulmonary artery systolic pressure.  3. The mitral valve is normal in structure. No evidence of mitral valve regurgitation. No evidence of mitral stenosis.  4. The aortic valve is normal in structure. Aortic valve regurgitation is not visualized. No aortic stenosis is present.  5. The inferior vena cava is normal in size with greater than 50% respiratory variability, suggesting right atrial pressure of 3 mmHg. FINDINGS  Left Ventricle: Left ventricular ejection fraction, by estimation, is 60 to 65%. The left ventricle has normal function. The left ventricle has no regional wall motion abnormalities. The left ventricular internal cavity size was normal in size. There is  no left ventricular hypertrophy. Left ventricular diastolic parameters were normal. Normal left ventricular filling pressure. Right Ventricle: The right ventricular size is normal. No increase in right ventricular wall thickness. Right ventricular systolic function is normal. There is normal pulmonary artery systolic pressure. The tricuspid regurgitant velocity is 2.07 m/s, and  with an assumed right atrial pressure of 3 mmHg, the estimated right ventricular systolic pressure is 20.1 mmHg. Left Atrium: Left atrial size was normal in size. Right Atrium: Right atrial size was normal in size. Pericardium: There is no evidence of pericardial effusion. Mitral Valve: The mitral valve is normal in structure. Normal mobility of the mitral valve  leaflets. No evidence of mitral valve regurgitation. No evidence of mitral valve stenosis. Tricuspid Valve: The tricuspid valve is normal in structure.  Tricuspid valve regurgitation is not demonstrated. No evidence of tricuspid stenosis. Aortic Valve: The aortic valve is normal in structure. Aortic valve regurgitation is not visualized. No aortic stenosis is present. Pulmonic Valve: The pulmonic valve was normal in structure. Pulmonic valve regurgitation is not visualized. No evidence of pulmonic stenosis. Aorta: The aortic root is normal in size and structure. Venous: The inferior vena cava is normal in size with greater than 50% respiratory variability, suggesting right atrial pressure of 3 mmHg. IAS/Shunts: No atrial level shunt detected by color flow Doppler.  LEFT VENTRICLE PLAX 2D LVIDd:         4.50 cm     Diastology LVIDs:         2.90 cm     LV e' lateral:   7.05 cm/s LV PW:         0.90 cm     LV E/e' lateral: 11.4 LV IVS:        0.90 cm     LV e' medial:    5.51 cm/s LVOT diam:     1.70 cm     LV E/e' medial:  14.6 LV SV:         63 LV SV Index:   38 LVOT Area:     2.27 cm  LV Volumes (MOD) LV vol d, MOD A2C: 45.4 ml LV vol d, MOD A4C: 70.8 ml LV vol s, MOD A2C: 28.7 ml LV vol s, MOD A4C: 33.1 ml LV SV MOD A2C:     16.7 ml LV SV MOD A4C:     70.8 ml LV SV MOD BP:      25.8 ml RIGHT VENTRICLE RV S prime:     9.86 cm/s TAPSE (M-mode): 2.1 cm LEFT ATRIUM             Index       RIGHT ATRIUM           Index LA diam:        3.60 cm 2.14 cm/m  RA Area:     13.50 cm LA Vol (A2C):   28.2 ml 16.74 ml/m RA Volume:   27.10 ml  16.09 ml/m LA Vol (A4C):   15.6 ml 9.26 ml/m LA Biplane Vol: 20.8 ml 12.35 ml/m  AORTIC VALVE LVOT Vmax:   97.90 cm/s LVOT Vmean:  69.400 cm/s LVOT VTI:    0.279 m  AORTA Ao Root diam: 3.00 cm MITRAL VALVE               TRICUSPID VALVE MV Area (PHT): 2.77 cm    TR Peak grad:   17.1 mmHg MV Decel Time: 274 msec    TR Vmax:        207.00 cm/s MV E velocity: 80.60 cm/s MV A velocity: 84.00 cm/s  SHUNTS MV E/A ratio:  0.96        Systemic VTI:  0.28 m                            Systemic Diam: 1.70 cm Rachelle Hora Croitoru MD Electronically  signed by Thurmon Fair MD Signature Date/Time: 08/09/2019/1:00:15 PM    Final    VAS US CAROTID (at [facility] and [facility] only)  Result Date: 08/09/2019 Carotid Arterial Duplex Study Indications:       CVA. Risk Factors:  None. Comparison Study:  No prior studies. Performing Technologist: Chanda Busing RVT  Examination Guidelines: A complete evaluation includes B-mode imaging, spectral Doppler, color Doppler, and power Doppler as needed of all accessible portions of each vessel. Bilateral testing is considered an integral part of a complete examination. Limited examinations for reoccurring indications may be performed as noted.  Right Carotid Findings: +----------+--------+--------+--------+-----------------------+--------+           PSV cm/sEDV cm/sStenosisPlaque Description     Comments +----------+--------+--------+--------+-----------------------+--------+ CCA Prox  48      9               smooth and heterogenous         +----------+--------+--------+--------+-----------------------+--------+ CCA Distal56      13              smooth and heterogenous         +----------+--------+--------+--------+-----------------------+--------+ ICA Prox  59      14              calcific                        +----------+--------+--------+--------+-----------------------+--------+ ICA Distal61      19                                     tortuous +----------+--------+--------+--------+-----------------------+--------+ ECA       63      8                                               +----------+--------+--------+--------+-----------------------+--------+ +----------+--------+-------+--------+-------------------+           PSV cm/sEDV cmsDescribeArm Pressure (mmHG) +----------+--------+-------+--------+-------------------+ [id number]                                        +----------+--------+-------+--------+-------------------+ +---------+--------+--+--------+-+---------+  VertebralPSV cm/s36EDV cm/s8Antegrade +---------+--------+--+--------+-+---------+  Left Carotid Findings: +----------+--------+--------+--------+-----------------------+--------+           PSV cm/sEDV cm/sStenosisPlaque Description     Comments +----------+--------+--------+--------+-----------------------+--------+ CCA Prox  73      12              smooth and heterogenous         +----------+--------+--------+--------+-----------------------+--------+ CCA Distal55      11              smooth and heterogenous         +----------+--------+--------+--------+-----------------------+--------+ ICA Prox  50      11              smooth and heterogenous         +----------+--------+--------+--------+-----------------------+--------+ ICA Distal60      25                                     tortuous +----------+--------+--------+--------+-----------------------+--------+ ECA       82      7                                               +----------+--------+--------+--------+-----------------------+--------+ +----------+--------+--------+--------+-------------------+  PSV cm/sEDV cm/sDescribeArm Pressure (mmHG) +----------+--------+--------+--------+-------------------+ [id number]     25                                  +----------+--------+--------+--------+-------------------+ +---------+--------+--+--------+--+---------+ VertebralPSV cm/s58EDV cm/s14Antegrade +---------+--------+--+--------+--+---------+   Summary: Right Carotid: Velocities in the right ICA are consistent with a 1-39% stenosis. Left Carotid: Velocities in the left ICA are consistent with a 1-39% stenosis. Vertebrals: Bilateral vertebral arteries demonstrate antegrade flow. *See table(s) above for measurements and observations.  Electronically signed by Lemar Livings MD on 08/09/2019 at 9:26:01 AM.    Final        Subjective: Patient seen and examined at bedside.  Grandson  at bedside interprets for the patient.  Patient feels okay and wants to go home.  No overnight fever, nausea, vomiting reported by her grandson.  Has tolerated diet.  Nursing staff reports that her heart rate dropped to the 30s/40s while sleeping.  Discharge Exam: Vitals:   08/10/19 0500 08/10/19 0837  BP:  (!) 156/67  Pulse:  (!) 50  Resp:  17  Temp:  97.9 F (36.6 C)  SpO2: 98% 96%    General: Pt is alert, awake, not in acute distress.  Communicates via grandson Cardiovascular: Bradycardic, currently heart rate in the 50s, S1/S2 + Respiratory: bilateral decreased breath sounds at bases Abdominal: Soft, NT, ND, bowel sounds + Extremities: no edema, no cyanosis    The results of significant diagnostics from this hospitalization (including imaging, microbiology, ancillary and laboratory) are listed below for reference.     Microbiology: Recent Results (from the past 240 hour(s))  SARS CORONAVIRUS 2 (TAT 6-24 HRS) Nasopharyngeal Nasopharyngeal Swab     Status: None   Collection Time: 08/08/19 11:11 PM   Specimen: Nasopharyngeal Swab  Result Value Ref Range Status   SARS Coronavirus 2 NEGATIVE NEGATIVE Final    Comment: (NOTE) SARS-CoV-2 target nucleic acids are NOT DETECTED. The SARS-CoV-2 RNA is generally detectable in upper and lower respiratory specimens during the acute phase of infection. Negative results do not preclude SARS-CoV-2 infection, do not rule out co-infections with other pathogens, and should not be used as the sole basis for treatment or other patient management decisions. Negative results must be combined with clinical observations, patient history, and epidemiological information. The expected result is Negative. Fact Sheet for Patients: HairSlick.no Fact Sheet for Healthcare Providers: quierodirigir.com This test is not yet approved or cleared by the [place] FDA and  has been authorized for  detection and/or diagnosis of SARS-CoV-2 by FDA under an Emergency Use Authorization (EUA). This EUA will remain  in effect (meaning this test can be used) for the duration of the COVID-19 declaration under Section 56 4(b)(1) of the Act, 21 U.S.C. section 360bbb-3(b)(1), unless the authorization is terminated or revoked sooner. Performed at [facility], [address] [place], [place] [address]      Labs: BNP (last 3 results) No results for input(s): BNP in the last 8760 hours. Basic Metabolic Panel: Recent Labs  Lab 08/04/19 1640 08/08/19 1644 08/08/19 1709 08/09/19 0348 08/10/19 0453  NA 142 142 140  --  140  K 3.7 4.1 3.9  --  3.8  CL 105 108 106  --  107  CO2 26 25  --   --  22  GLUCOSE 165* 94 88  --  77  BUN 19 13 16   --  9  CREATININE 0.75 0.83 0.80 0.73  0.75  CALCIUM 9.7 9.4  --   --  9.1  MG  --   --   --   --  2.1   Liver Function Tests: Recent Labs  Lab 08/04/19 1640 08/08/19 1644  AST 33 29  ALT 29 29  ALKPHOS 53 49  BILITOT 0.7 0.5  PROT 7.7 7.4  ALBUMIN 4.4 4.1   No results for input(s): LIPASE, AMYLASE in the last 168 hours. No results for input(s): AMMONIA in the last 168 hours. CBC: Recent Labs  Lab 08/04/19 1640 08/08/19 1644 08/08/19 1709 08/09/19 0348 08/10/19 0453  WBC 5.8 7.2  --  6.4 5.5  NEUTROABS 3.4 4.1  --   --  2.2  HGB 12.9 12.3 12.9 12.2 11.8*  HCT 40.1 38.9 38.0 37.4 36.2  MCV 80.5 81.7  --  80.8 79.6*  PLT 302 281  --  262 264   Cardiac Enzymes: No results for input(s): CKTOTAL, CKMB, CKMBINDEX, TROPONINI in the last 168 hours. BNP: Invalid input(s): POCBNP CBG: Recent Labs  Lab 08/04/19 1640  GLUCAP 168*   D-Dimer No results for input(s): DDIMER in the last 72 hours. Hgb A1c Recent Labs    08/07/19 1451 08/09/19 0348  HGBA1C 5.2 5.4   Lipid Profile Recent Labs    08/09/19 0348  CHOL 279*  HDL 74  LDLCALC 188*  TRIG 84  CHOLHDL 3.8   Thyroid function studies Recent Labs     08/07/19 1451  TSH 0.821   Anemia work up Recent Labs    08/07/19 1451  VITAMINB12 802   Urinalysis    Component Value Date/Time   COLORURINE YELLOW 08/09/2019 1010   APPEARANCEUR CLEAR 08/09/2019 1010   LABSPEC 1.010 08/09/2019 1010   PHURINE 6.0 08/09/2019 1010   GLUCOSEU NEGATIVE 08/09/2019 1010   HGBUR NEGATIVE 08/09/2019 1010   BILIRUBINUR NEGATIVE 08/09/2019 1010   KETONESUR NEGATIVE 08/09/2019 1010   PROTEINUR NEGATIVE 08/09/2019 1010   NITRITE NEGATIVE 08/09/2019 1010   LEUKOCYTESUR NEGATIVE 08/09/2019 1010   Sepsis Labs Invalid input(s): PROCALCITONIN,  WBC,  LACTICIDVEN Microbiology Recent Results (from the past 240 hour(s))  SARS CORONAVIRUS 2 (TAT 6-24 HRS) Nasopharyngeal Nasopharyngeal Swab     Status: None   Collection Time: 08/08/19 11:11 PM   Specimen: Nasopharyngeal Swab  Result Value Ref Range Status   SARS Coronavirus 2 NEGATIVE NEGATIVE Final    Comment: (NOTE) SARS-CoV-2 target nucleic acids are NOT DETECTED. The SARS-CoV-2 RNA is generally detectable in upper and lower respiratory specimens during the acute phase of infection. Negative results do not preclude SARS-CoV-2 infection, do not rule out co-infections with other pathogens, and should not be used as the sole basis for treatment or other patient management decisions. Negative results must be combined with clinical observations, patient history, and epidemiological information. The expected result is Negative. Fact Sheet for Patients: HairSlick.no Fact Sheet for Healthcare Providers: quierodirigir.com This test is not yet approved or cleared by the [place] FDA and  has been authorized for detection and/or diagnosis of SARS-CoV-2 by FDA under an Emergency Use Authorization (EUA). This EUA will remain  in effect (meaning this test can be used) for the duration of the COVID-19 declaration under Section 56 4(b)(1) of the Act, 21  U.S.C. section 360bbb-3(b)(1), unless the authorization is terminated or revoked sooner. Performed at [facility], [address] [place], [place] [address]      Time coordinating discharge: 35 minutes  SIGNED:   Glade Lloyd, MD  [facility] 08/10/2019, 9:26 AM

## 2019-08-10 NOTE — Progress Notes (Signed)
STROKE TEAM PROGRESS NOTE   INTERVAL HISTORY Son at bedside, served as Equities trader.  Patient still has expressive aphasia, anomia and difficulty repeating.  However follow simple commands.  No motor deficit.  MRI consistent with embolic pattern.  Family wants patient to go home and will arrange for outpatient loop recorder.  Vital signs stable  Vitals:   08/10/19 0357 08/10/19 0500 08/10/19 0837 08/10/19 1209  BP: (!) 133/57  (!) 156/67 (!) 163/69  Pulse: (!) 57  (!) 50   Resp: 18  17 18   Temp: 98 F (36.7 C)  97.9 F (36.6 C) 98 F (36.7 C)  TempSrc: Oral  Oral Oral  SpO2: 98% 98% 96% 97%  Weight:        CBC:  Recent Labs  Lab 08/08/19 1644 08/08/19 1709 08/09/19 0348 08/10/19 0453  WBC 7.2   < > 6.4 5.5  NEUTROABS 4.1  --   --  2.2  HGB 12.3   < > 12.2 11.8*  HCT 38.9   < > 37.4 36.2  MCV 81.7   < > 80.8 79.6*  PLT 281   < > 262 264   < > = values in this interval not displayed.    Basic Metabolic Panel:  Recent Labs  Lab 08/08/19 1644 08/08/19 1644 08/08/19 1709 08/08/19 1709 08/09/19 0348 08/10/19 0453  NA 142   < > 140  --   --  140  K 4.1   < > 3.9  --   --  3.8  CL 108   < > 106  --   --  107  CO2 25  --   --   --   --  22  GLUCOSE 94   < > 88  --   --  77  BUN 13   < > 16  --   --  9  CREATININE 0.83   < > 0.80   < > 0.73 0.75  CALCIUM 9.4  --   --   --   --  9.1  MG  --   --   --   --   --  2.1   < > = values in this interval not displayed.   Lipid Panel:     Component Value Date/Time   CHOL 279 (H) 08/09/2019 0348   TRIG 84 08/09/2019 0348   HDL 74 08/09/2019 0348   CHOLHDL 3.8 08/09/2019 0348   VLDL 17 08/09/2019 0348   LDLCALC 188 (H) 08/09/2019 0348   HgbA1c:  Lab Results  Component Value Date   HGBA1C 5.4 08/09/2019   Urine Drug Screen: No results found for: LABOPIA, COCAINSCRNUR, LABBENZ, AMPHETMU, THCU, LABBARB  Alcohol Level No results found for: ETH  IMAGING past 24 hours CT ANGIO HEAD W OR WO CONTRAST  Result Date:  08/09/2019 CLINICAL DATA:  69 year old female with recent confusion and aphasia beginning on 08/04/2019 found to have left MCA territory infarct on MRI yesterday. Susceptibility at the left MCA bifurcation on that exam. EXAM: CT ANGIOGRAPHY HEAD AND NECK TECHNIQUE: Multidetector CT imaging of the head and neck was performed using the standard protocol during bolus administration of intravenous contrast. Multiplanar CT image reconstructions and MIPs were obtained to evaluate the vascular anatomy. Carotid stenosis measurements (when applicable) are obtained utilizing NASCET criteria, using the distal internal carotid diameter as the denominator. CONTRAST:  10/04/2019 OMNIPAQUE IOHEXOL 350 MG/ML SOLN COMPARISON:  Brain MRI 08/08/2019.  Head CT 08/04/2019. FINDINGS: CT HEAD Brain: Posterior left insula,  temporal lobe cytotoxic edema corresponding to the DWI abnormality yesterday. No acute intracranial hemorrhage identified. No midline shift or intracranial mass effect. Elsewhere gray-white matter differentiation appears stable since 08/04/2019. No ventriculomegaly. Calvarium and skull base: No acute osseous abnormality identified. Paranasal sinuses: Visualized paranasal sinuses and mastoids are stable and well pneumatized. Orbits: Visualized orbits and scalp soft tissues are within normal limits. CTA NECK Skeleton: No acute osseous abnormality identified. Mild for age cervical spine degeneration. Scattered dental caries. Upper chest: Negative. Other neck: 11 mm hypodense left thyroid nodule, Not clinically significant; no follow-up imaging recommended (ref: J Am Coll Radiol. 2015 Feb;12(2): 143-50).Otherwise negative neck soft tissues. Aortic arch: 3 vessel arch configuration. Mild to moderate arch atherosclerosis. Right carotid system: Negative brachiocephalic artery and right CCA origin. Soft and calcified plaque in the medial vessel at the level of the larynx, no stenosis. Calcified plaque at the bifurcation, right ICA  origin and bulb is fairly mild with no stenosis. Mild cervical right ICA tortuosity. Left carotid system: Left CCA origin mild atherosclerosis without stenosis. Mild soft plaque in the vessel proximal to the bifurcation. Mild soft and calcified plaque at the bifurcation, left ICA origin and bulb without stenosis. Tortuous left ICA with a partially retropharyngeal course distal to the bulb. Vertebral arteries: Normal proximal right subclavian artery. Calcified plaque at the right vertebral artery origin with moderate to severe stenosis (series 12, image 125). The right vertebral is non dominant and remains diminutive but patent to the skull base. No proximal left subclavian artery stenosis despite plaque. Calcified plaque at the left vertebral artery origin but only mild stenosis (also on image 125). Dominant left vertebral artery is patent to the skull base without additional plaque or stenosis. CTA HEAD Posterior circulation: No distal vertebral artery plaque or stenosis. The left V4 is dominant. Patent left PICA and dominant appearing right AICA origins. Patent vertebrobasilar junction and basilar artery without stenosis. Patent SCA and PCA origins. Posterior communicating arteries are diminutive or absent. Mild bilateral PCA irregularity without significant stenosis. Anterior circulation: Both ICA siphons are patent. On the left moderate calcified plaque results in mild to moderate stenosis in the distal cavernous segment and anterior genu. Normal left ophthalmic artery origin. On the right moderate calcified plaque results in mild cavernous and supraclinoid segment stenosis. Patent carotid termini, MCA and ACA origins. Codominant A1 segments. Normal anterior communicating artery and bilateral ACA branches. Right MCA M1 segment and bifurcation are patent without stenosis. Right MCA branches are within normal limits. Left MCA M1 segment and bifurcation are patent. There is mild irregularity at the bifurcation but  no significant stenosis. See however, I suspect a posterior M2 branch origin occlusion on series 16, image 26, although the distal vessel cannot be delineated. Other left MCA branches seen within normal limits Venous sinuses: Patent. Anatomic variants: Dominant left vertebral artery, the right is diminutive. Review of the MIP images confirms the above findings IMPRESSION: 1. Appearance suspicious for a Left M2 branch occlusion at the bifurcation on series 16 image 26, with no branch reconstitution. No other significant Left MCA stenosis. 2. Bilateral carotid atherosclerosis, with up to moderate left ICA siphon stenosis due to calcified plaque. Mild right siphon stenosis, and no significant stenosis in the neck. 3. Mild stenosis at the origin of the dominant left vertebral artery. Moderate to severe stenosis at the origin of the non dominant right vertebral which is diminutive. Otherwise negative posterior circulation. 4. Expected CT appearance of the recent Left MCA infarct. No hemorrhagic transformation,  mass effect, or new intracranial abnormality. 5.  Aortic Atherosclerosis (ICD10-I70.0). Electronically Signed   By: Odessa Fleming M.D.   On: 08/09/2019 16:20   CT ANGIO NECK W OR WO CONTRAST  Result Date: 08/09/2019 CLINICAL DATA:  69 year old female with recent confusion and aphasia beginning on 08/04/2019 found to have left MCA territory infarct on MRI yesterday. Susceptibility at the left MCA bifurcation on that exam. EXAM: CT ANGIOGRAPHY HEAD AND NECK TECHNIQUE: Multidetector CT imaging of the head and neck was performed using the standard protocol during bolus administration of intravenous contrast. Multiplanar CT image reconstructions and MIPs were obtained to evaluate the vascular anatomy. Carotid stenosis measurements (when applicable) are obtained utilizing NASCET criteria, using the distal internal carotid diameter as the denominator. CONTRAST:  OMNIPAQUE IOHEXOL 350 MG/ML SOLN COMPARISON:  Brain MRI  08/08/2019.  Head CT 08/04/2019. FINDINGS: CT HEAD Brain: Posterior left insula, temporal lobe cytotoxic edema corresponding to the DWI abnormality yesterday. No acute intracranial hemorrhage identified. No midline shift or intracranial mass effect. Elsewhere gray-white matter differentiation appears stable since 08/04/2019. No ventriculomegaly. Calvarium and skull base: No acute osseous abnormality identified. Paranasal sinuses: Visualized paranasal sinuses and mastoids are stable and well pneumatized. Orbits: Visualized orbits and scalp soft tissues are within normal limits. CTA NECK Skeleton: No acute osseous abnormality identified. Mild for age cervical spine degeneration. Scattered dental caries. Upper chest: Negative. Other neck: 11 mm hypodense left thyroid nodule, Not clinically significant; no follow-up imaging recommended (ref: J Am Coll Radiol. 2015 Feb;12(2): 143-50).Otherwise negative neck soft tissues. Aortic arch: 3 vessel arch configuration. Mild to moderate arch atherosclerosis. Right carotid system: Negative brachiocephalic artery and right CCA origin. Soft and calcified plaque in the medial vessel at the level of the larynx, no stenosis. Calcified plaque at the bifurcation, right ICA origin and bulb is fairly mild with no stenosis. Mild cervical right ICA tortuosity. Left carotid system: Left CCA origin mild atherosclerosis without stenosis. Mild soft plaque in the vessel proximal to the bifurcation. Mild soft and calcified plaque at the bifurcation, left ICA origin and bulb without stenosis. Tortuous left ICA with a partially retropharyngeal course distal to the bulb. Vertebral arteries: Normal proximal right subclavian artery. Calcified plaque at the right vertebral artery origin with moderate to severe stenosis (series 12, image 125). The right vertebral is non dominant and remains diminutive but patent to the skull base. No proximal left subclavian artery stenosis despite plaque. Calcified  plaque at the left vertebral artery origin but only mild stenosis (also on image 125). Dominant left vertebral artery is patent to the skull base without additional plaque or stenosis. CTA HEAD Posterior circulation: No distal vertebral artery plaque or stenosis. The left V4 is dominant. Patent left PICA and dominant appearing right AICA origins. Patent vertebrobasilar junction and basilar artery without stenosis. Patent SCA and PCA origins. Posterior communicating arteries are diminutive or absent. Mild bilateral PCA irregularity without significant stenosis. Anterior circulation: Both ICA siphons are patent. On the left moderate calcified plaque results in mild to moderate stenosis in the distal cavernous segment and anterior genu. Normal left ophthalmic artery origin. On the right moderate calcified plaque results in mild cavernous and supraclinoid segment stenosis. Patent carotid termini, MCA and ACA origins. Codominant A1 segments. Normal anterior communicating artery and bilateral ACA branches. Right MCA M1 segment and bifurcation are patent without stenosis. Right MCA branches are within normal limits. Left MCA M1 segment and bifurcation are patent. There is mild irregularity at the bifurcation but no significant  stenosis. See however, I suspect a posterior M2 branch origin occlusion on series 16, image 26, although the distal vessel cannot be delineated. Other left MCA branches seen within normal limits Venous sinuses: Patent. Anatomic variants: Dominant left vertebral artery, the right is diminutive. Review of the MIP images confirms the above findings IMPRESSION: 1. Appearance suspicious for a Left M2 branch occlusion at the bifurcation on series 16 image 26, with no branch reconstitution. No other significant Left MCA stenosis. 2. Bilateral carotid atherosclerosis, with up to moderate left ICA siphon stenosis due to calcified plaque. Mild right siphon stenosis, and no significant stenosis in the neck. 3.  Mild stenosis at the origin of the dominant left vertebral artery. Moderate to severe stenosis at the origin of the non dominant right vertebral which is diminutive. Otherwise negative posterior circulation. 4. Expected CT appearance of the recent Left MCA infarct. No hemorrhagic transformation, mass effect, or new intracranial abnormality. 5.  Aortic Atherosclerosis (ICD10-I70.0). Electronically Signed   By: Odessa Fleming M.D.   On: 08/09/2019 16:20   VAS [place] LOWER EXTREMITY VENOUS (DVT)  Result Date: 08/10/2019  Lower Venous DVTStudy Indications: Stroke.  Comparison Study: No prior study on file Performing Technologist: Sherren Kerns RVS  Examination Guidelines: A complete evaluation includes B-mode imaging, spectral Doppler, color Doppler, and power Doppler as needed of all accessible portions of each vessel. Bilateral testing is considered an integral part of a complete examination. Limited examinations for reoccurring indications may be performed as noted. The reflux portion of the exam is performed with the patient in reverse Trendelenburg.  +---------+---------------+---------+-----------+----------+--------------+ RIGHT    CompressibilityPhasicitySpontaneityPropertiesThrombus Aging +---------+---------------+---------+-----------+----------+--------------+ CFV      Full           Yes      Yes                                 +---------+---------------+---------+-----------+----------+--------------+ SFJ      Full                                                        +---------+---------------+---------+-----------+----------+--------------+ FV Prox  Full                                                        +---------+---------------+---------+-----------+----------+--------------+ FV Mid   Full                                                        +---------+---------------+---------+-----------+----------+--------------+ FV DistalFull                                                         +---------+---------------+---------+-----------+----------+--------------+ PFV      Full                                                        +---------+---------------+---------+-----------+----------+--------------+  POP      Full           Yes      Yes                                 +---------+---------------+---------+-----------+----------+--------------+ PTV      Full                                                        +---------+---------------+---------+-----------+----------+--------------+ PERO     Full                                                        +---------+---------------+---------+-----------+----------+--------------+   +---------+---------------+---------+-----------+----------+--------------+ LEFT     CompressibilityPhasicitySpontaneityPropertiesThrombus Aging +---------+---------------+---------+-----------+----------+--------------+ CFV      Full           Yes      Yes                                 +---------+---------------+---------+-----------+----------+--------------+ SFJ      Full                                                        +---------+---------------+---------+-----------+----------+--------------+ FV Prox  Full                                                        +---------+---------------+---------+-----------+----------+--------------+ FV Mid   Full                                                        +---------+---------------+---------+-----------+----------+--------------+ FV DistalFull                                                        +---------+---------------+---------+-----------+----------+--------------+ PFV      Full                                                        +---------+---------------+---------+-----------+----------+--------------+ POP      Full           Yes      Yes                                  +---------+---------------+---------+-----------+----------+--------------+  PTV      Full                                                        +---------+---------------+---------+-----------+----------+--------------+ PERO     Full                                                        +---------+---------------+---------+-----------+----------+--------------+     Summary: BILATERAL: - No evidence of deep vein thrombosis seen in the lower extremities, bilaterally.   *See table(s) above for measurements and observations.    Preliminary     PHYSICAL EXAM  Temp:  [97.7 F (36.5 C)-98.6 F (37 C)] 98 F (36.7 C) (04/10 1209) Pulse Rate:  [40-63] 50 (04/10 0837) Resp:  [16-18] 18 (04/10 1209) BP: (133-166)/(54-107) 163/69 (04/10 1209) SpO2:  [96 %-99 %] 97 % (04/10 1209)  General - Well nourished, well developed, in no apparent distress.  Ophthalmologic - fundi not visualized due to noncooperation.  Cardiovascular - Regular rhythm and rate.  Neuro - awake alert, following simple commands.  However, expressive aphasia, naming 1 out of 4, not able to repeat sentences.  PERRL, EOMI, right lower quadrantanopsia.  No significant facial droop, tongue midline.  Bilateral upper and lower extremity 5/5.  Sensation symmetric, bilateral finger-to-nose intact.  Gait not tested.   ASSESSMENT/PLAN Toni Davila is a 69 y.o. [place] female with no significant past medical history of presenting to [facility] ED with confusion and word finding difficulty Sx resolved and family elevated for OP workup. OP MRI positive for stroke   Stroke:   L MCA  infarct embolic infarcts secondary to unknown source  CT head 4/4  No acute abnormality.      MRI  Moderate L MCA infarct, possible L MCA bifurcation thrombus.  CTA head & neck pending  Carotid Doppler  B ICA 1-39% stenosis, VAs antegrade   2D Echo EF 60-65%. No source of embolus   LE venous doppler pending   Recommend loop  recorder to rule out A. fib.  If patient remains hospitalized until Monday, may consider loop recorder on Monday.  However, if patient is to be discharged over the weekend, will need to arrange loop recorder as outpatient  LDL 188  HgbA1c 5.2  Lovenox 40 mg sq daily for VTE prophylaxis  No antithrombotic prior to stroke sx onset, now on aspirin 81 mg daily and clopidogrel 75 mg daily. Continue DAPT x 3 weeks then aspirin alone.   Therapy recommendations:  HH PT, HH OT  Disposition:  pending   Hypertension  Home meds:  None, no hx HTN  Elevated on admission but trending down to 130-150s . Long-term BP goal normotensive  Hyperlipidemia  Home meds:  No statin  Now on lipitor 80   LDL 188, goal < 70  Continue statin at discharge  Other Stroke Risk Factors  Advanced age  Hospital day # 2 Agree with discharge home today and outpatient loop recorder for paroxysmal A. fib.  Discussed with patient and son at the bedside and answered questions.  Follow-up as outpatient stroke clinic in 6 weeks Delia Heady, MD Stroke Neurology  08/10/2019 3:50 PM    To contact Stroke Continuity provider, please refer to [email or web address]. After hours, contact General Neurology

## 2019-08-10 NOTE — Progress Notes (Signed)
IV and tele removed without complication. AVS reviewed with patients sons and the patient at bedside. All questions addressed prior to discharge. Patients family was able to assist in translation for education. Pt discharged from facility via wheelchair.

## 2019-08-10 NOTE — Progress Notes (Signed)
VASCULAR LAB PRELIMINARY  PRELIMINARY  PRELIMINARY  PRELIMINARY  Bilateral lower extremity venous duplex completed.    Preliminary report:   See CV proc for preliminary results.   Corbyn Wildey, RVT 08/10/2019, 11:52 AM

## 2019-08-10 NOTE — TOC Transition Note (Signed)
Transition of Care [facility]) - CM/SW Discharge Note   Patient Details  Name: Ethleen Lormand MRN: [id number] Date of Birth: 20-Jun-1950  [facility] CM/SW Contact:  Lawerance Sabal, RN Phone Number: 08/10/2019, 10:37 AM   Clinical Narrative:   Sherron Monday w patient's son. He confirmed afddress and phone number, agreed to [facility] services, stated no preference for agency. [facility] w highest ratings overall on [email or web address] Referral placed and accepted.     Final next level of care: Home w Home Health Services Barriers to Discharge: No Barriers Identified   Patient Goals and CMS Choice Patient states their goals for this hospitalization and ongoing recovery are:: to go hoem CMS Medicare.gov Compare Post Acute Care list provided to:: Other (Comment Required) Choice offered to / list presented to : Adult Children  Discharge Placement                       Discharge Plan and Services                          HH Arranged: PT, OT, Speech Therapy HH Agency: [facility] Date [facility] Agency Contacted: 08/10/19 Time HH Agency Contacted: 1037 Representative spoke with at [facility] Agency: Kandee Keen  Social Determinants of Health (SDOH) Interventions     Readmission Risk Interventions No flowsheet data found.

## 2019-08-12 ENCOUNTER — Telehealth: Payer: Self-pay | Admitting: Cardiovascular Disease

## 2019-08-12 NOTE — Telephone Encounter (Signed)
   Pt's daughter said she needs to come in with pt to her appt on 08/16/19. She said pt doesn't remember well and need to assist her to translate, her Mom doesn't trust other people (interpeter)  Please advise

## 2019-08-14 NOTE — Telephone Encounter (Signed)
Follow up  Pt is rescheduled and now scheduled to see Dr. Katrinka Blazing on Friday. Same request if the pt can bring Son with her his name is Dery. Daughter can't make it.  Please advise

## 2019-08-14 NOTE — Telephone Encounter (Signed)
Spoke with daughter and made her aware, ok for someone to accompany pt to appt.

## 2019-08-15 ENCOUNTER — Telehealth: Payer: Self-pay

## 2019-08-15 NOTE — Telephone Encounter (Signed)
Outreach made to Pt's daughter.  Pt rescheduled with [facility] for 08/22/19 for loop implant.

## 2019-08-16 ENCOUNTER — Ambulatory Visit: Payer: Medicare Other | Admitting: Interventional Cardiology

## 2019-08-16 ENCOUNTER — Ambulatory Visit: Payer: Medicare Other | Admitting: Cardiovascular Disease

## 2019-08-22 ENCOUNTER — Ambulatory Visit (INDEPENDENT_AMBULATORY_CARE_PROVIDER_SITE_OTHER): Payer: Medicare Other | Admitting: Internal Medicine

## 2019-08-22 ENCOUNTER — Other Ambulatory Visit: Payer: Self-pay

## 2019-08-22 ENCOUNTER — Encounter: Payer: Self-pay | Admitting: Internal Medicine

## 2019-08-22 VITALS — BP 112/72 | HR 58 | Ht 61.0 in | Wt 146.0 lb

## 2019-08-22 DIAGNOSIS — I639 Cerebral infarction, unspecified: Secondary | ICD-10-CM | POA: Insufficient documentation

## 2019-08-22 DIAGNOSIS — E782 Mixed hyperlipidemia: Secondary | ICD-10-CM

## 2019-08-22 HISTORY — PX: OTHER SURGICAL HISTORY: SHX169

## 2019-08-22 NOTE — Progress Notes (Signed)
Electrophysiology Office Note   Date:  08/25/2019   ID:  Toni, Davila 12-15-1950, MRN [id number]  PCP:  Mila Palmer, MD   Primary Electrophysiologist: Hillis Range, MD    CC: stroke   History of Present Illness: Toni Davila is a 69 y.o. female who presents today for electrophysiology evaluation.   She is referred by Dr Roda Shutters for consideration of arrhythmia as a possible cause for her stroke.   History is obtained via a translator in the office today.    She recently presented with L MCA stroke.  Workup has been unremarkable.  She is making good recovery.  Today, she denies symptoms of chest pain, shortness of breath,  lower extremity edema, dizziness, presyncope, syncope, or new neurologic sequela.    Past Medical History:  Diagnosis Date  . Anemia   . Stroke (cerebrum) (HCC)    L MCA stroke  . TIA (transient ischemic attack)    History reviewed. No pertinent surgical history.   Current Outpatient Medications  Medication Sig Dispense Refill  . aspirin 81 MG chewable tablet Chew 1 tablet (81 mg total) by mouth daily. 90 tablet 0  . atorvastatin (LIPITOR) 80 MG tablet Take 1 tablet (80 mg total) by mouth daily at 6 PM. 30 tablet 0  . clopidogrel (PLAVIX) 75 MG tablet Take 1 tablet (75 mg total) by mouth daily. 21 tablet 0  . ibuprofen (ADVIL) 200 MG tablet Take 200 mg by mouth every 6 (six) hours as needed for headache or mild pain.     No current facility-administered medications for this visit.    Allergies:   Patient has no known allergies.   Social History:  The patient  reports that she has never smoked. She has never used smokeless tobacco. She reports that she does not drink alcohol or use drugs.   Family History:  + HTN   ROS:  Please see the history of present illness.   All other systems are personally reviewed and negative.    PHYSICAL EXAM: VS:  BP 112/72   Pulse (!) 58   Ht 5\' 1"  (1.549 m)   Wt 146 lb (66.2 kg)   SpO2 98%   BMI 27.59 kg/m  , BMI  Body mass index is 27.59 kg/m. GEN: Well nourished, well developed, in no acute distress  HEENT: normal  Neck: no JVD, carotid bruits, or masses Cardiac: RRR  Respiratory:   normal work of breathing GI: soft  MS: no deformity or atrophy  Skin: warm and dry  Neuro:  Strength and sensation are intact Psych: euthymic mood, full affect  EKG:  EKGs in epic are reviewed and reveal sinus bradycardia All ekgs in epic are reviewed and none show afib  Recent Labs: 08/07/2019: TSH 0.821 08/08/2019: ALT 29 08/10/2019: BUN 9; Creatinine, Ser 0.75; Hemoglobin 11.8; Magnesium 2.1; Platelets 264; Potassium 3.8; Sodium 140  personally reviewed   Lipid Panel     Component Value Date/Time   CHOL 279 (H) 08/09/2019 0348   TRIG 84 08/09/2019 0348   HDL 74 08/09/2019 0348   CHOLHDL 3.8 08/09/2019 0348   VLDL 17 08/09/2019 0348   LDLCALC 188 (H) 08/09/2019 0348   personally reviewed   Wt Readings from Last 3 Encounters:  08/22/19 146 lb (66.2 kg)  08/09/19 144 lb 10 oz (65.6 kg)  08/07/19 148 lb 6.4 oz (67.3 kg)      Other studies personally reviewed: Additional studies/ records that were reviewed today include: Dr 10/07/19 notes,  Recent hopsital records, MRI,echo  Review of the above records today demonstrates: as above   ASSESSMENT AND PLAN:  1. Cryptogenic stroke The patient presents with cryptogenic stroke.  Workup has been unrevealing.   I spoke at length with the patient and her son (via Optometrist) about monitoring for afib with an implantable loop recorder.  Risks, benefits, and alteratives to implantable loop recorder were discussed with the patient today.   At this time, the patient is very clear in their decision to proceed with implantable loop recorder.    2. HL Continue statin    Thompson Grayer, MD     DESCRIPTION OF PROCEDURE:  Informed written consent was obtained.  The patient required no sedation for the procedure today.  The patients left chest was prepped and draped.  Mapping over the patient's chest was performed to identify the appropriate ILR site.  This area was found to be the left parasternal region over the 3rd-4th intercostal space.  The skin overlying this region was infiltrated with lidocaine for local analgesia.  A 0.5-cm incision was made at the implant site.  A subcutaneous ILR pocket was fashioned using a combination of sharp and blunt dissection.  A Medtronic Reveal Linq model [id number]) implantable loop recorder was then placed into the pocket R waves were very prominent and measured > 0.2 mV. EBL<1 ml.  Steri- Strips and a sterile dressing were then applied.  There were no early apparent complications.     CONCLUSIONS:   1. Successful implantation of a Medtronic Reveal LINQ implantable loop recorder for cryptogenic stroke  2. No early apparent complications.   Thompson Grayer MD, [facility]

## 2019-08-22 NOTE — Patient Instructions (Addendum)
Medication Instructions:  Your physician recommends that you continue on your current medications as directed. Please refer to the Current Medication list given to you today.  Labwork: None ordered.  Testing/Procedures: None ordered.  Follow-Up:   Your physician wants you to follow-up in: as needed with Dr. Allred.    Any Other Special Instructions Will Be Listed Below (If Applicable).  If you need a refill on your cardiac medications before your next appointment, please call your pharmacy.    Implantable Loop Recorder Placement, Care After This sheet gives you information about how to care for yourself after your procedure. Your health care provider may also give you more specific instructions. If you have problems or questions, contact your health care provider. What can I expect after the procedure? After the procedure, it is common to have:  Soreness or discomfort near the incision.  Some swelling or bruising near the incision. Follow these instructions at home: Incision care   Follow instructions from your health care provider about how to take care of your incision. Make sure you: ? Leave your outer dressing on for 24 hours.  After 24 hours you can remove that and shower. ? Leave adhesive strips in place. These skin closures may need to stay in place for 2 weeks or longer. If adhesive strip edges start to loosen and curl up, you may trim the loose edges. Do not remove adhesive strips completely unless your health care provider tells you to do that.  Check your incision area every day for signs of infection. Check for: ? Redness, swelling, or pain. ? Fluid or blood. ? Warmth. ? Pus or a bad smell. Do not take baths, swim, or use a hot tub until your incision is completely healed.  Activity  Return to your normal activities.  General instructions  Follow instructions from your health care provider about how to manage your implantable loop recorder and transmit the  information. Learn how to activate a recording if this is necessary for your type of device.  Do not go through a metal detection gate, and do not let someone hold a metal detector over your chest. Show your ID card.  Do not have an MRI unless you check with your health care provider first.  Take over-the-counter and prescription medicines only as told by your health care provider.  Keep all follow-up visits as told by your health care provider. This is important. Contact a health care provider if:  You have redness, swelling, or pain around your incision.  You have a fever.  You have pain that is not relieved by your pain medicine.  You have triggered your device because of fainting (syncope) or because of a heartbeat that feels like it is racing, slow, fluttering, or skipping (palpitations). Get help right away if you have:  Chest pain.  Difficulty breathing. Summary  After the procedure, it is common to have soreness or discomfort near the incision.  Change your dressing as told by your health care provider.  Follow instructions from your health care provider about how to manage your implantable loop recorder and transmit the information.  Keep all follow-up visits as told by your health care provider. This is important. This information is not intended to replace advice given to you by your health care provider. Make sure you discuss any questions you have with your health care provider. Document Released: 03/30/2015 Document Revised: 06/03/2017 Document Reviewed: 06/03/2017 Elsevier Patient Education  2020 Elsevier Inc.  

## 2019-08-25 ENCOUNTER — Encounter: Payer: Self-pay | Admitting: Internal Medicine

## 2019-09-18 ENCOUNTER — Ambulatory Visit: Payer: Medicare Other | Admitting: Diagnostic Neuroimaging

## 2019-09-23 ENCOUNTER — Ambulatory Visit (INDEPENDENT_AMBULATORY_CARE_PROVIDER_SITE_OTHER): Payer: Medicare Other | Admitting: *Deleted

## 2019-09-23 DIAGNOSIS — I639 Cerebral infarction, unspecified: Secondary | ICD-10-CM

## 2019-09-23 LAB — CUP PACEART REMOTE DEVICE CHECK
Date Time Interrogation Session: 20210523110501
Implantable Pulse Generator Implant Date: 20210422

## 2019-09-24 NOTE — Progress Notes (Signed)
Carelink Summary Report / Loop Recorder 

## 2019-10-01 ENCOUNTER — Encounter: Payer: Self-pay | Admitting: Diagnostic Neuroimaging

## 2019-10-01 ENCOUNTER — Telehealth: Payer: Self-pay | Admitting: *Deleted

## 2019-10-01 ENCOUNTER — Inpatient Hospital Stay: Payer: Medicare Other | Admitting: Diagnostic Neuroimaging

## 2019-10-01 NOTE — Telephone Encounter (Signed)
Patient was no show for new patient appointment today. 

## 2019-10-26 ENCOUNTER — Ambulatory Visit: Payer: Self-pay | Attending: Internal Medicine

## 2019-10-26 DIAGNOSIS — Z23 Encounter for immunization: Secondary | ICD-10-CM

## 2019-10-26 NOTE — Progress Notes (Signed)
   Covid-19 Vaccination Clinic  Name:  Toni Davila    MRN: [id number] DOB: 14-Feb-1951  10/26/2019  [name] was observed post Covid-19 immunization for 15 minutes without incident. She was provided with Vaccine Information Sheet and instruction to access the V-Safe system.   [name] was instructed to call 911 with any severe reactions post vaccine: Marland Kitchen Difficulty breathing  . Swelling of face and throat  . A fast heartbeat  . A bad rash all over body  . Dizziness and weakness   Immunizations Administered    Name Date Dose VIS Date Route   Pfizer COVID-19 Vaccine 10/26/2019 10:59 AM 0.3 mL 06/26/2018 Intramuscular   Manufacturer: [employer], [employer]   Lot: [id number]   NDC: [address]-8

## 2019-10-28 ENCOUNTER — Ambulatory Visit (INDEPENDENT_AMBULATORY_CARE_PROVIDER_SITE_OTHER): Payer: Medicare Other | Admitting: *Deleted

## 2019-10-28 DIAGNOSIS — I639 Cerebral infarction, unspecified: Secondary | ICD-10-CM

## 2019-10-28 LAB — CUP PACEART REMOTE DEVICE CHECK
Date Time Interrogation Session: 20210628000619
Implantable Pulse Generator Implant Date: 20210422

## 2019-10-29 NOTE — Progress Notes (Signed)
Carelink Summary Report / Loop Recorder 

## 2019-11-05 ENCOUNTER — Encounter: Payer: Self-pay | Admitting: Diagnostic Neuroimaging

## 2019-11-05 ENCOUNTER — Other Ambulatory Visit: Payer: Self-pay

## 2019-11-05 ENCOUNTER — Ambulatory Visit (INDEPENDENT_AMBULATORY_CARE_PROVIDER_SITE_OTHER): Payer: Medicare Other | Admitting: Diagnostic Neuroimaging

## 2019-11-05 VITALS — BP 125/70 | HR 57 | Ht 61.0 in | Wt 142.0 lb

## 2019-11-05 DIAGNOSIS — I63512 Cerebral infarction due to unspecified occlusion or stenosis of left middle cerebral artery: Secondary | ICD-10-CM | POA: Diagnosis not present

## 2019-11-05 DIAGNOSIS — R4701 Aphasia: Secondary | ICD-10-CM

## 2019-11-05 NOTE — Progress Notes (Signed)
[facility]  PATIENT: Toni Davila DOB: 1950/06/15  REFERRING CLINICIAN: Mila Palmer, MD HISTORY FROM: patient and son and interpreter REASON FOR VISIT: follow up   HISTORICAL  CHIEF COMPLAINT:  Chief Complaint  Patient presents with  . Stroke    rm 6 son-Dery, interpreter- Toni Davila    HISTORY OF PRESENT ILLNESS:   UPDATE (11/05/19, VRP): Since last visit, was admitted for stroke on MRI. Workup completed. Doing better now. Aphasia improving. No alleviating or aggravating factors. Tolerating meds.    PRIOR HPI: 69 year old female here for evaluation of transient confusion.  08/04/2019 patient woke up and was having difficulty speaking.  She was generally weak and acting confused.  Patient normally lives with her grandson.  Patient's daughter and son-in-law communicated with the grandson over phone.  911 was called and patient was taken to the hospital.  CT head and lab testing were done which showed no acute findings.  They were advised to be admitted to complete MRI and the EEG but family opted for outpatient work-up.  Since that time symptoms have been persistent.  Patient reverts to speaking an uncommon dialect with her daughter as compared to their main native language.  She is having difficulty understanding as well as naming objects and people.  Today during our evaluation patient is not able to identify her daughter, son-in-law by their name or by the relation.  Patient says it is okay and I do not know in her native language.  Of note patient has been under extremely high stress related to her job and starting on Sunday was complaining of people at her job threatening to get her fired.  Patient also complaining of left frontal and periorbital headaches.  No prior history of headaches.   REVIEW OF SYSTEMS: Full 14 system review of systems performed and negative with exception of: as per HPI.   ALLERGIES: No Known Allergies  HOME MEDICATIONS: Outpatient  Medications Prior to Visit  Medication Sig Dispense Refill  . aspirin EC 81 MG tablet Take 81 mg by mouth daily. Swallow whole.    Marland Kitchen atorvastatin (LIPITOR) 80 MG tablet Take 1 tablet (80 mg total) by mouth daily at 6 PM. 30 tablet 0  . ibuprofen (ADVIL) 200 MG tablet Take 200 mg by mouth every 6 (six) hours as needed for headache or mild pain.    Marland Kitchen clopidogrel (PLAVIX) 75 MG tablet Take 1 tablet (75 mg total) by mouth daily. (Patient not taking: Reported on 11/05/2019) 21 tablet 0   No facility-administered medications prior to visit.    PAST MEDICAL HISTORY: Past Medical History:  Diagnosis Date  . Anemia   . Stroke (cerebrum) (HCC) 08/08/2019   L MCA stroke  . TIA (transient ischemic attack)     PAST SURGICAL HISTORY: Past Surgical History:  Procedure Laterality Date  . implantable loop recorder implantation  08/22/2019    Medtronic Reveal [place] model [id number]) implantable loop recorder implanted by Dr Johney Frame for cryptogenic stroke    FAMILY HISTORY: No family history on file.  SOCIAL HISTORY: Social History   Socioeconomic History  . Marital status: Single    Spouse name: Not on file  . Number of children: 6  . Years of education: Not on file  . Highest education level: Not on file  Occupational History  . Not on file  Tobacco Use  . Smoking status: Never Smoker  . Smokeless tobacco: Never Used  Substance and Sexual Activity  . Alcohol use: Never  .  Drug use: Never  . Sexual activity: Not on file  Other Topics Concern  . Not on file  Social History Narrative   08/07/19 lives with her son   Social Determinants of Health   Financial Resource Strain:   . Difficulty of Paying Living Expenses:   Food Insecurity:   . Worried About Programme researcher, broadcasting/film/video in the Last Year:   . Barista in the Last Year:   Transportation Needs:   . Freight forwarder (Medical):   Marland Kitchen Lack of Transportation (Non-Medical):   Physical Activity:   . Days of Exercise per  Week:   . Minutes of Exercise per Session:   Stress:   . Feeling of Stress :   Social Connections:   . Frequency of Communication with Friends and Family:   . Frequency of Social Gatherings with Friends and Family:   . Attends Religious Services:   . Active Member of Clubs or Organizations:   . Attends Banker Meetings:   Marland Kitchen Marital Status:   Intimate Partner Violence:   . Fear of Current or Ex-Partner:   . Emotionally Abused:   Marland Kitchen Physically Abused:   . Sexually Abused:      PHYSICAL EXAM  GENERAL EXAM/CONSTITUTIONAL: Vitals:  Vitals:   11/05/19 1313  BP: 125/70  Pulse: (!) 57  Weight: 142 lb (64.4 kg)  Height: 5\' 1"  (1.549 m)   Body mass index is 26.83 kg/m. Wt Readings from Last 3 Encounters:  11/05/19 142 lb (64.4 kg)  08/22/19 146 lb (66.2 kg)  08/09/19 144 lb 10 oz (65.6 kg)    Patient is in no distress; well developed, nourished and groomed; neck is supple  CARDIOVASCULAR:  Examination of carotid arteries is normal; no carotid bruits  Regular rate and rhythm, no murmurs  Examination of peripheral vascular system by observation and palpation is normal  EYES:  Ophthalmoscopic exam of optic discs and posterior segments is normal; no papilledema or hemorrhages No exam data present  MUSCULOSKELETAL:  Gait, strength, tone, movements noted in Neurologic exam below  NEUROLOGIC: MENTAL STATUS:  No flowsheet data found.  awake, alert; ABLE TO SAY SONS NAME; ALMOST CAN NAME PEN, WITH MILD PARAPHASIC ERROR; ABLE TO REPEAT  decr memory   decr attention and concentration  decr fluency; comprehension INTACT  CRANIAL NERVE:   2nd - no papilledema on fundoscopic exam  2nd, 3rd, 4th, 6th - pupils equal and reactive to light, visual fields full to confrontation, extraocular muscles intact, no nystagmus  5th - facial sensation symmetric  7th - facial strength symmetric  8th - hearing intact  9th - palate elevates symmetrically, uvula  midline  11th - shoulder shrug symmetric  12th - tongue protrusion midline  MOTOR:   normal bulk and tone, full strength in the BUE, BLE  SENSORY:   normal and symmetric to light touch  COORDINATION:   finger-nose-finger, fine finger movements normal  REFLEXES:   deep tendon reflexes trace and symmetric  GAIT/STATION:   narrow based gait     DIAGNOSTIC DATA (LABS, IMAGING, TESTING) - I reviewed patient records, labs, notes, testing and imaging myself where available.  Lab Results  Component Value Date   WBC 5.5 08/10/2019   HGB 11.8 (L) 08/10/2019   HCT 36.2 08/10/2019   MCV 79.6 (L) 08/10/2019   PLT 264 08/10/2019      Component Value Date/Time   NA 140 08/10/2019 0453   K 3.8 08/10/2019 0453   CL  107 08/10/2019 0453   CO2 22 08/10/2019 0453   GLUCOSE 77 08/10/2019 0453   BUN 9 08/10/2019 0453   CREATININE 0.75 08/10/2019 0453   CALCIUM 9.1 08/10/2019 0453   PROT 7.4 08/08/2019 1644   ALBUMIN 4.1 08/08/2019 1644   AST 29 08/08/2019 1644   ALT 29 08/08/2019 1644   ALKPHOS 49 08/08/2019 1644   BILITOT 0.5 08/08/2019 1644   GFRNONAA >60 08/10/2019 0453   GFRAA >60 08/10/2019 0453   Lab Results  Component Value Date   CHOL 279 (H) 08/09/2019   HDL 74 08/09/2019   LDLCALC 188 (H) 08/09/2019   TRIG 84 08/09/2019   CHOLHDL 3.8 08/09/2019   Lab Results  Component Value Date   HGBA1C 5.4 08/09/2019   Lab Results  Component Value Date   VITAMINB12 802 08/07/2019   Lab Results  Component Value Date   TSH 0.821 08/07/2019     08/04/19 CT head  - negative  08/08/19 MRI brain [I reviewed images myself and agree with interpretation. -VRP]  1. Moderate size subacute Left MCA territory infarct centered at the posterior temporal lobe and insula. No associated hemorrhage or significant mass effect. 2. Major vascular flow voids are preserved, but susceptibility at the left MCA bifurcation raises the possibility thrombus in the vessel there. 3. Elsewhere  largely unremarkable for age MRI appearance of the brain.  08/09/19 CTA head / neck  1. Appearance suspicious for a Left M2 branch occlusion at the bifurcation on series 16 image 26, with no branch reconstitution. No other significant Left MCA stenosis.  2. Bilateral carotid atherosclerosis, with up to moderate left ICA siphon stenosis due to calcified plaque. Mild right siphon stenosis, and no significant stenosis in the neck.  3. Mild stenosis at the origin of the dominant left vertebral artery. Moderate to severe stenosis at the origin of the non dominant right vertebral which is diminutive. Otherwise negative posterior circulation.  4. Expected CT appearance of the recent Left MCA infarct. No hemorrhagic transformation, mass effect, or new intracranial abnormality.   ASSESSMENT AND PLAN  69 y.o. year old female here with new onset confusion, aphasia, memory loss on 08/04/2019.  MRI confirmed ischemic strokes. S/p workup in hospital in April 2021.   Dx:   1. Acute ischemic left MCA stroke (HCC)   2. Aphasia     PLAN:  Left MCA embolic strokes --> APHASIA / CONFUSION  - continue aspirin 81mg  daily, atorvastatin 80mg  daily - continue heart monitor  Return for return to PCP, pending if symptoms worsen or fail to improve.    , MD 11/05/2019, 1:41 PM Certified in Neurology, Neurophysiology and Neuroimaging  [facility] [address], [address] [place], [address] [place] [phone]

## 2019-11-16 ENCOUNTER — Ambulatory Visit: Payer: Self-pay | Attending: Internal Medicine

## 2019-11-16 DIAGNOSIS — Z23 Encounter for immunization: Secondary | ICD-10-CM

## 2019-11-16 NOTE — Progress Notes (Signed)
° °  Covid-19 Vaccination Clinic  Name:  Toni Davila    MRN: [id number] DOB: 1950-07-13  11/16/2019  Toni Davila was observed post Covid-19 immunization for 15 minutes without incident. She was provided with Vaccine Information Sheet and instruction to access the V-Safe system.   Toni Davila was instructed to call 911 with any severe reactions post vaccine:  Difficulty breathing   Swelling of face and throat   A fast heartbeat   A bad rash all over body   Dizziness and weakness   Immunizations Administered    Name Date Dose VIS Date Route   Pfizer COVID-19 Vaccine 11/16/2019  9:30 AM 0.3 mL 06/26/2018 Intramuscular   Manufacturer: [employer], [employer]   Lot: [id number]   NDC: [address]-8

## 2020-03-16 ENCOUNTER — Ambulatory Visit: Payer: Medicare Other

## 2020-04-10 ENCOUNTER — Ambulatory Visit (INDEPENDENT_AMBULATORY_CARE_PROVIDER_SITE_OTHER): Payer: Medicare Other

## 2020-04-10 DIAGNOSIS — I639 Cerebral infarction, unspecified: Secondary | ICD-10-CM

## 2020-04-10 LAB — CUP PACEART REMOTE DEVICE CHECK
Date Time Interrogation Session: 20211210014503
Implantable Pulse Generator Implant Date: 20210422

## 2020-04-23 NOTE — Progress Notes (Signed)
Carelink Summary Report / Loop Recorder 

## 2020-05-11 ENCOUNTER — Ambulatory Visit (INDEPENDENT_AMBULATORY_CARE_PROVIDER_SITE_OTHER): Payer: Medicare Other

## 2020-05-11 DIAGNOSIS — I639 Cerebral infarction, unspecified: Secondary | ICD-10-CM | POA: Diagnosis not present

## 2020-05-13 LAB — CUP PACEART REMOTE DEVICE CHECK
Date Time Interrogation Session: 20220112030130
Implantable Pulse Generator Implant Date: 20210422

## 2020-05-25 NOTE — Progress Notes (Signed)
Carelink Summary Report / Loop Recorder 

## 2020-06-02 ENCOUNTER — Ambulatory Visit: Payer: Medicare Other | Attending: Internal Medicine

## 2020-06-02 DIAGNOSIS — Z23 Encounter for immunization: Secondary | ICD-10-CM

## 2020-06-02 NOTE — Progress Notes (Signed)
   Covid-19 Vaccination Clinic  Name:  Toni Davila    MRN: [id number] DOB: 1950-08-03  06/02/2020  Ms. [name] was observed post Covid-19 immunization for 15 minutes without incident. She was provided with Vaccine Information Sheet and instruction to access the V-Safe system.   Ms. [name] was instructed to call 911 with any severe reactions post vaccine: Marland Kitchen Difficulty breathing  . Swelling of face and throat  . A fast heartbeat  . A bad rash all over body  . Dizziness and weakness   Immunizations Administered    Name Date Dose VIS Date Route   PFIZER Comrnaty(Gray TOP) Covid-19 Vaccine 06/02/2020  1:53 PM 0.3 mL 04/09/2020 Intramuscular   Manufacturer: [employer], [employer]   Lot: [id number]   NDC: [phone]

## 2020-06-11 ENCOUNTER — Ambulatory Visit (INDEPENDENT_AMBULATORY_CARE_PROVIDER_SITE_OTHER): Payer: Medicare Other

## 2020-06-11 DIAGNOSIS — I639 Cerebral infarction, unspecified: Secondary | ICD-10-CM

## 2020-06-15 LAB — CUP PACEART REMOTE DEVICE CHECK
Date Time Interrogation Session: 20220214033159
Implantable Pulse Generator Implant Date: 20210422

## 2020-06-18 NOTE — Progress Notes (Signed)
Carelink Summary Report / Loop Recorder 

## 2020-07-13 ENCOUNTER — Ambulatory Visit (INDEPENDENT_AMBULATORY_CARE_PROVIDER_SITE_OTHER): Payer: Medicare Other

## 2020-07-13 DIAGNOSIS — I639 Cerebral infarction, unspecified: Secondary | ICD-10-CM | POA: Diagnosis not present

## 2020-07-20 LAB — CUP PACEART REMOTE DEVICE CHECK
Date Time Interrogation Session: 20220319043010
Implantable Pulse Generator Implant Date: 20210422

## 2020-07-21 NOTE — Progress Notes (Signed)
Carelink Summary Report / Loop Recorder 

## 2020-08-13 ENCOUNTER — Ambulatory Visit (INDEPENDENT_AMBULATORY_CARE_PROVIDER_SITE_OTHER): Payer: Medicare Other

## 2020-08-13 DIAGNOSIS — I639 Cerebral infarction, unspecified: Secondary | ICD-10-CM | POA: Diagnosis not present

## 2020-08-13 LAB — CUP PACEART REMOTE DEVICE CHECK
Date Time Interrogation Session: 20220412230830
Implantable Pulse Generator Implant Date: 20210422

## 2020-08-31 NOTE — Progress Notes (Signed)
Carelink Summary Report / Loop Recorder 

## 2020-09-14 ENCOUNTER — Ambulatory Visit (INDEPENDENT_AMBULATORY_CARE_PROVIDER_SITE_OTHER): Payer: Medicare Other

## 2020-09-14 DIAGNOSIS — I639 Cerebral infarction, unspecified: Secondary | ICD-10-CM

## 2020-09-16 LAB — CUP PACEART REMOTE DEVICE CHECK
Date Time Interrogation Session: 20220515233755
Implantable Pulse Generator Implant Date: 20210422

## 2020-10-07 NOTE — Progress Notes (Signed)
Carelink Summary Report / Loop Recorder 

## 2020-10-15 ENCOUNTER — Ambulatory Visit (INDEPENDENT_AMBULATORY_CARE_PROVIDER_SITE_OTHER): Payer: Medicare Other

## 2020-10-15 DIAGNOSIS — I639 Cerebral infarction, unspecified: Secondary | ICD-10-CM

## 2020-10-18 LAB — CUP PACEART REMOTE DEVICE CHECK
Date Time Interrogation Session: 20220617233836
Implantable Pulse Generator Implant Date: 20210422

## 2020-11-05 NOTE — Progress Notes (Signed)
Carelink Summary Report / Loop Recorder 

## 2020-11-16 ENCOUNTER — Ambulatory Visit (INDEPENDENT_AMBULATORY_CARE_PROVIDER_SITE_OTHER): Payer: Medicare Other

## 2020-11-16 DIAGNOSIS — I639 Cerebral infarction, unspecified: Secondary | ICD-10-CM

## 2020-11-19 LAB — CUP PACEART REMOTE DEVICE CHECK
Date Time Interrogation Session: 20220720234742
Implantable Pulse Generator Implant Date: 20210422

## 2020-11-26 DIAGNOSIS — R519 Headache, unspecified: Secondary | ICD-10-CM | POA: Diagnosis not present

## 2020-11-26 DIAGNOSIS — I779 Disorder of arteries and arterioles, unspecified: Secondary | ICD-10-CM | POA: Diagnosis not present

## 2020-11-26 DIAGNOSIS — R7303 Prediabetes: Secondary | ICD-10-CM | POA: Diagnosis not present

## 2020-11-26 DIAGNOSIS — I10 Essential (primary) hypertension: Secondary | ICD-10-CM | POA: Diagnosis not present

## 2020-11-26 DIAGNOSIS — I6930 Unspecified sequelae of cerebral infarction: Secondary | ICD-10-CM | POA: Diagnosis not present

## 2020-11-26 DIAGNOSIS — I700 Atherosclerosis of aorta: Secondary | ICD-10-CM | POA: Diagnosis not present

## 2020-11-26 DIAGNOSIS — E785 Hyperlipidemia, unspecified: Secondary | ICD-10-CM | POA: Diagnosis not present

## 2020-12-08 NOTE — Progress Notes (Signed)
Carelink Summary Report / Loop Recorder 

## 2020-12-17 ENCOUNTER — Ambulatory Visit (INDEPENDENT_AMBULATORY_CARE_PROVIDER_SITE_OTHER): Payer: Medicare Other

## 2020-12-17 DIAGNOSIS — I639 Cerebral infarction, unspecified: Secondary | ICD-10-CM

## 2020-12-22 LAB — CUP PACEART REMOTE DEVICE CHECK
Date Time Interrogation Session: 20220822235058
Implantable Pulse Generator Implant Date: 20210422

## 2021-01-06 NOTE — Progress Notes (Signed)
Carelink Summary Report / Loop Recorder 

## 2021-01-18 ENCOUNTER — Ambulatory Visit (INDEPENDENT_AMBULATORY_CARE_PROVIDER_SITE_OTHER): Payer: Medicare Other

## 2021-01-18 DIAGNOSIS — I639 Cerebral infarction, unspecified: Secondary | ICD-10-CM | POA: Diagnosis not present

## 2021-01-25 LAB — CUP PACEART REMOTE DEVICE CHECK
Date Time Interrogation Session: 20220924235001
Implantable Pulse Generator Implant Date: 20210422

## 2021-01-25 NOTE — Progress Notes (Signed)
Carelink Summary Report / Loop Recorder 

## 2021-03-31 ENCOUNTER — Ambulatory Visit (INDEPENDENT_AMBULATORY_CARE_PROVIDER_SITE_OTHER): Payer: Medicare Other

## 2021-03-31 DIAGNOSIS — I639 Cerebral infarction, unspecified: Secondary | ICD-10-CM

## 2021-03-31 LAB — CUP PACEART REMOTE DEVICE CHECK
Date Time Interrogation Session: 20221129225447
Implantable Pulse Generator Implant Date: 20210422

## 2021-04-09 NOTE — Progress Notes (Signed)
Carelink Summary Report / Loop Recorder 

## 2021-05-04 ENCOUNTER — Ambulatory Visit (INDEPENDENT_AMBULATORY_CARE_PROVIDER_SITE_OTHER): Payer: Medicare Other

## 2021-05-04 DIAGNOSIS — I639 Cerebral infarction, unspecified: Secondary | ICD-10-CM

## 2021-05-04 LAB — CUP PACEART REMOTE DEVICE CHECK
Date Time Interrogation Session: 20230102233013
Implantable Pulse Generator Implant Date: 20210422

## 2021-05-14 NOTE — Progress Notes (Signed)
Carelink Summary Report / Loop Recorder 

## 2021-06-09 IMAGING — CT CT ANGIO NECK
1 of 11 series · 5 of 33 positions shown · IV contrast (omnipaque)
Comparison: Brain MRI 08/08/2019.  Head CT 08/04/2019.

CLINICAL DATA: 68-year-old female with recent confusion and aphasia
beginning on 08/04/2019 found to have left MCA territory infarct on
MRI yesterday. Susceptibility at the left MCA bifurcation on that
exam.

EXAM:
CT ANGIOGRAPHY HEAD AND NECK
TECHNIQUE: Multidetector CT imaging of the head and neck was performed using
the standard protocol during bolus administration of intravenous
contrast. Multiplanar CT image reconstructions and MIPs were
obtained to evaluate the vascular anatomy. Carotid stenosis
measurements (when applicable) are obtained utilizing NASCET
criteria, using the distal internal carotid diameter as the
denominator.
CONTRAST:  100mL OMNIPAQUE IOHEXOL 350 MG/ML SOLN

[Series 11: cta neck axial · axial · 0.39mm/px · z∈[+1069,+1295]mm · 5 of 352 slices shown]
[im 59/352  soft-tissue]
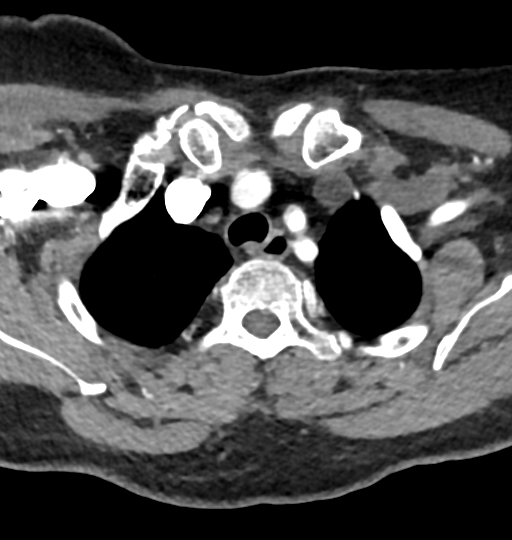
[im 118/352  bone]
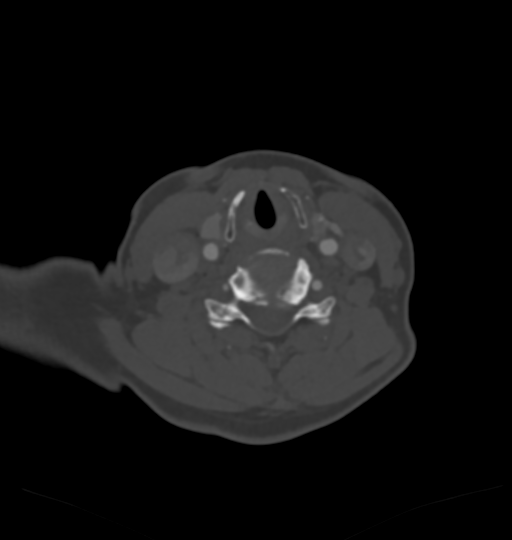
[im 176/352  soft-tissue]
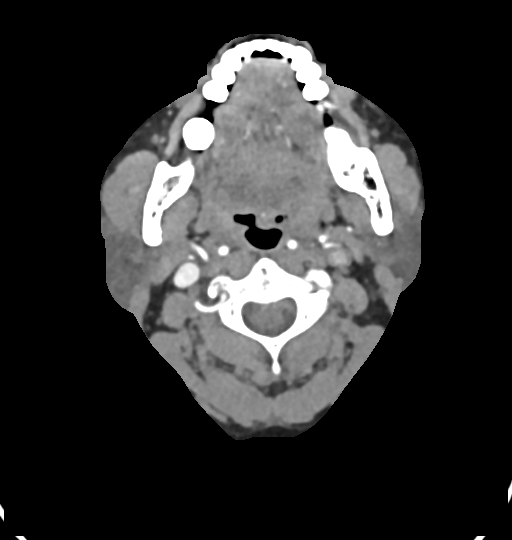
[im 235/352  bone]
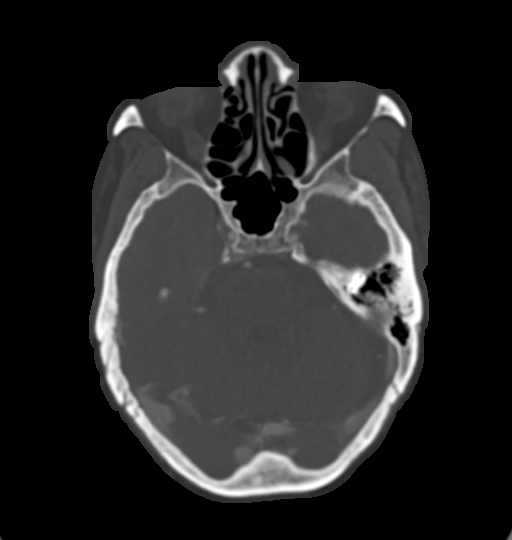
[im 293/352  soft-tissue]
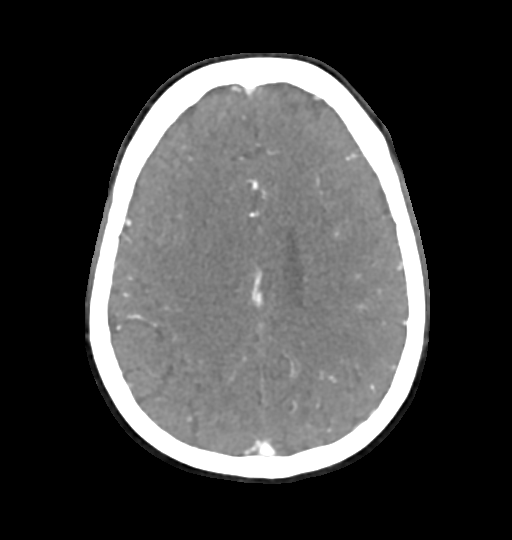

[5 of 33 positions shown; findings below may reference images not displayed]

FINDINGS: CT HEAD

Brain: Posterior left insula, temporal lobe cytotoxic edema
corresponding to the DWI abnormality yesterday. No acute
intracranial hemorrhage identified. No midline shift or intracranial
mass effect. Elsewhere gray-white matter differentiation appears
stable since 08/04/2019. No ventriculomegaly.

Calvarium and skull base: No acute osseous abnormality identified.

Paranasal sinuses: Visualized paranasal sinuses and mastoids are
stable and well pneumatized.

Orbits: Visualized orbits and scalp soft tissues are within normal
limits.

CTA NECK

Skeleton: No acute osseous abnormality identified. Mild for age
cervical spine degeneration. Scattered dental caries.

Upper chest: Negative.

Other neck: 11 mm hypodense left thyroid nodule, Not clinically
significant; no follow-up imaging recommended (ref: [HOSPITAL]. [DATE]): 143-50).Otherwise negative neck soft
tissues.

Aortic arch: 3 vessel arch configuration. Mild to moderate arch
atherosclerosis.

Right carotid system: Negative brachiocephalic artery and right CCA
origin. Soft and calcified plaque in the medial vessel at the level
of the larynx, no stenosis. Calcified plaque at the bifurcation,
right ICA origin and bulb is fairly mild with no stenosis. Mild
cervical right ICA tortuosity.

Left carotid system: Left CCA origin mild atherosclerosis without
stenosis. Mild soft plaque in the vessel proximal to the
bifurcation. Mild soft and calcified plaque at the bifurcation, left
ICA origin and bulb without stenosis. Tortuous left ICA with a
partially retropharyngeal course distal to the bulb.

Vertebral arteries:
Normal proximal right subclavian artery. Calcified plaque at the
right vertebral artery origin with moderate to severe stenosis
(series 12, image 125). The right vertebral is non dominant and
remains diminutive but patent to the skull base.

No proximal left subclavian artery stenosis despite plaque.
Calcified plaque at the left vertebral artery origin but only mild
stenosis (also on image 125). Dominant left vertebral artery is
patent to the skull base without additional plaque or stenosis.

CTA HEAD

Posterior circulation: No distal vertebral artery plaque or
stenosis. The left V4 is dominant. Patent left PICA and dominant
appearing right AICA origins. Patent vertebrobasilar junction and
basilar artery without stenosis. Patent SCA and PCA origins.
Posterior communicating arteries are diminutive or absent. Mild
bilateral PCA irregularity without significant stenosis.

Anterior circulation: Both ICA siphons are patent. On the left
moderate calcified plaque results in mild to moderate stenosis in
the distal cavernous segment and anterior genu. Normal left
ophthalmic artery origin.

On the right moderate calcified plaque results in mild cavernous and
supraclinoid segment stenosis.

Patent carotid termini, MCA and ACA origins. Codominant A1 segments.
Normal anterior communicating artery and bilateral ACA branches.
Right MCA M1 segment and bifurcation are patent without stenosis.
Right MCA branches are within normal limits.

Left MCA M1 segment and bifurcation are patent. There is mild
irregularity at the bifurcation but no significant stenosis. See
however, I suspect a posterior M2 branch origin occlusion on series
16, image 26, although the distal vessel cannot be delineated.
Other left MCA branches seen within normal limits

Venous sinuses: Patent.

Anatomic variants: Dominant left vertebral artery, the right is
diminutive.

Review of the MIP images confirms the above findings
IMPRESSION: 1. Appearance suspicious for a Left M2 branch occlusion at the
bifurcation on series 16 image 26, with no branch reconstitution. No
other significant Left MCA stenosis.

2. Bilateral carotid atherosclerosis, with up to moderate left ICA
siphon stenosis due to calcified plaque. Mild right siphon stenosis,
and no significant stenosis in the neck.

3. Mild stenosis at the origin of the dominant left vertebral
artery. Moderate to severe stenosis at the origin of the non
dominant right vertebral which is diminutive. Otherwise negative
posterior circulation.

4. Expected CT appearance of the recent Left MCA infarct. No
hemorrhagic transformation, mass effect, or new intracranial
abnormality.

5.  Aortic Atherosclerosis ([id number]).

## 2021-08-16 ENCOUNTER — Ambulatory Visit (INDEPENDENT_AMBULATORY_CARE_PROVIDER_SITE_OTHER): Payer: Medicare Other

## 2021-08-16 DIAGNOSIS — I639 Cerebral infarction, unspecified: Secondary | ICD-10-CM | POA: Diagnosis not present

## 2021-08-17 LAB — CUP PACEART REMOTE DEVICE CHECK
Date Time Interrogation Session: 20230414231137
Implantable Pulse Generator Implant Date: 20210422

## 2021-09-02 NOTE — Progress Notes (Signed)
Carelink Summary Report / Loop Recorder 

## 2021-09-19 LAB — CUP PACEART REMOTE DEVICE CHECK
Date Time Interrogation Session: 20230517232027
Implantable Pulse Generator Implant Date: 20210422

## 2021-09-20 ENCOUNTER — Ambulatory Visit (INDEPENDENT_AMBULATORY_CARE_PROVIDER_SITE_OTHER): Payer: Medicare Other

## 2021-09-20 DIAGNOSIS — I639 Cerebral infarction, unspecified: Secondary | ICD-10-CM | POA: Diagnosis not present

## 2021-10-06 NOTE — Progress Notes (Signed)
Carelink Summary Report / Loop Recorder 

## 2021-10-25 ENCOUNTER — Ambulatory Visit (INDEPENDENT_AMBULATORY_CARE_PROVIDER_SITE_OTHER): Payer: Medicare Other

## 2021-10-25 DIAGNOSIS — I639 Cerebral infarction, unspecified: Secondary | ICD-10-CM

## 2021-10-26 LAB — CUP PACEART REMOTE DEVICE CHECK
Date Time Interrogation Session: 20230619232442
Implantable Pulse Generator Implant Date: 20210422

## 2021-11-19 NOTE — Progress Notes (Signed)
Carelink Summary Report / Loop Recorder 

## 2021-11-25 LAB — CUP PACEART REMOTE DEVICE CHECK
Date Time Interrogation Session: 20230722233548
Implantable Pulse Generator Implant Date: 20210422

## 2021-11-29 ENCOUNTER — Ambulatory Visit (INDEPENDENT_AMBULATORY_CARE_PROVIDER_SITE_OTHER): Payer: Medicare Other | Admitting: Internal Medicine

## 2021-11-29 DIAGNOSIS — I639 Cerebral infarction, unspecified: Secondary | ICD-10-CM

## 2021-12-21 NOTE — Progress Notes (Signed)
Remote reviewed. Battery status noted.  Leads function stable

## 2021-12-30 LAB — CUP PACEART REMOTE DEVICE CHECK
Date Time Interrogation Session: 20230831212142
Implantable Pulse Generator Implant Date: 20210422

## 2022-01-04 ENCOUNTER — Ambulatory Visit (INDEPENDENT_AMBULATORY_CARE_PROVIDER_SITE_OTHER): Payer: Medicare Other

## 2022-01-04 DIAGNOSIS — I639 Cerebral infarction, unspecified: Secondary | ICD-10-CM

## 2022-01-27 NOTE — Progress Notes (Signed)
Carelink Summary Report / Loop Recorder 

## 2022-02-02 LAB — CUP PACEART REMOTE DEVICE CHECK
Date Time Interrogation Session: 20231002232432
Implantable Pulse Generator Implant Date: 20210422

## 2022-02-07 ENCOUNTER — Ambulatory Visit (INDEPENDENT_AMBULATORY_CARE_PROVIDER_SITE_OTHER): Payer: Medicare Other

## 2022-02-07 DIAGNOSIS — I639 Cerebral infarction, unspecified: Secondary | ICD-10-CM | POA: Diagnosis not present

## 2022-02-16 NOTE — Progress Notes (Signed)
Carelink Summary Report / Loop Recorder 

## 2022-03-10 LAB — CUP PACEART REMOTE DEVICE CHECK
Date Time Interrogation Session: 20231104231950
Implantable Pulse Generator Implant Date: 20210422

## 2022-03-14 ENCOUNTER — Ambulatory Visit (INDEPENDENT_AMBULATORY_CARE_PROVIDER_SITE_OTHER): Payer: Medicare Other

## 2022-03-14 DIAGNOSIS — I639 Cerebral infarction, unspecified: Secondary | ICD-10-CM

## 2022-04-18 ENCOUNTER — Ambulatory Visit (INDEPENDENT_AMBULATORY_CARE_PROVIDER_SITE_OTHER): Payer: Medicare Other

## 2022-04-18 DIAGNOSIS — I639 Cerebral infarction, unspecified: Secondary | ICD-10-CM

## 2022-04-19 LAB — CUP PACEART REMOTE DEVICE CHECK
Date Time Interrogation Session: 20231217232540
Implantable Pulse Generator Implant Date: 20210422

## 2022-04-28 NOTE — Progress Notes (Signed)
Carelink Summary Report / Loop Recorder 

## 2022-05-23 ENCOUNTER — Ambulatory Visit: Payer: Medicare Other | Attending: Cardiovascular Disease

## 2022-05-23 DIAGNOSIS — I639 Cerebral infarction, unspecified: Secondary | ICD-10-CM | POA: Diagnosis not present

## 2022-05-25 LAB — CUP PACEART REMOTE DEVICE CHECK
Date Time Interrogation Session: 20240119233050
Implantable Pulse Generator Implant Date: 20210422

## 2022-05-26 NOTE — Progress Notes (Signed)
[facility] Summary Report / Loop Recorder

## 2022-06-27 ENCOUNTER — Ambulatory Visit: Payer: Medicare Other

## 2022-06-27 DIAGNOSIS — I639 Cerebral infarction, unspecified: Secondary | ICD-10-CM | POA: Diagnosis not present

## 2022-06-28 LAB — CUP PACEART REMOTE DEVICE CHECK
Date Time Interrogation Session: 20240221233333
Implantable Pulse Generator Implant Date: 20210422

## 2022-07-11 NOTE — Progress Notes (Signed)
Carelink Summary Report / Loop Recorder 

## 2022-08-01 ENCOUNTER — Ambulatory Visit (INDEPENDENT_AMBULATORY_CARE_PROVIDER_SITE_OTHER): Payer: Medicare Other

## 2022-08-01 DIAGNOSIS — I639 Cerebral infarction, unspecified: Secondary | ICD-10-CM | POA: Diagnosis not present

## 2022-08-02 LAB — CUP PACEART REMOTE DEVICE CHECK
Date Time Interrogation Session: 20240331232549
Implantable Pulse Generator Implant Date: 20210422

## 2022-08-08 NOTE — Progress Notes (Signed)
Carelink Summary Report / Loop Recorder 

## 2022-09-05 ENCOUNTER — Ambulatory Visit (INDEPENDENT_AMBULATORY_CARE_PROVIDER_SITE_OTHER): Payer: Medicare Other

## 2022-09-05 DIAGNOSIS — I639 Cerebral infarction, unspecified: Secondary | ICD-10-CM | POA: Diagnosis not present

## 2022-09-05 LAB — CUP PACEART REMOTE DEVICE CHECK
Date Time Interrogation Session: 20240503231529
Implantable Pulse Generator Implant Date: 20210422

## 2022-09-06 DIAGNOSIS — Z0000 Encounter for general adult medical examination without abnormal findings: Secondary | ICD-10-CM | POA: Diagnosis not present

## 2022-09-06 DIAGNOSIS — E785 Hyperlipidemia, unspecified: Secondary | ICD-10-CM | POA: Diagnosis not present

## 2022-09-06 DIAGNOSIS — Z79899 Other long term (current) drug therapy: Secondary | ICD-10-CM | POA: Diagnosis not present

## 2022-09-06 DIAGNOSIS — E559 Vitamin D deficiency, unspecified: Secondary | ICD-10-CM | POA: Diagnosis not present

## 2022-09-06 DIAGNOSIS — R7303 Prediabetes: Secondary | ICD-10-CM | POA: Diagnosis not present

## 2022-09-06 DIAGNOSIS — Z1211 Encounter for screening for malignant neoplasm of colon: Secondary | ICD-10-CM | POA: Diagnosis not present

## 2022-09-06 DIAGNOSIS — I779 Disorder of arteries and arterioles, unspecified: Secondary | ICD-10-CM | POA: Diagnosis not present

## 2022-09-08 NOTE — Progress Notes (Signed)
Carelink Summary Report / Loop Recorder 

## 2022-10-05 NOTE — Progress Notes (Signed)
Carelink Summary Report / Loop Recorder 

## 2022-10-06 ENCOUNTER — Ambulatory Visit (INDEPENDENT_AMBULATORY_CARE_PROVIDER_SITE_OTHER): Payer: Medicare Other

## 2022-10-06 DIAGNOSIS — I639 Cerebral infarction, unspecified: Secondary | ICD-10-CM | POA: Diagnosis not present

## 2022-10-06 LAB — CUP PACEART REMOTE DEVICE CHECK
Date Time Interrogation Session: 20240605231956
Implantable Pulse Generator Implant Date: 20210422

## 2022-10-26 NOTE — Progress Notes (Signed)
Carelink Summary Report / Loop Recorder 

## 2022-11-07 LAB — CUP PACEART REMOTE DEVICE CHECK: Implantable Pulse Generator Implant Date: 20210422

## 2022-11-08 ENCOUNTER — Ambulatory Visit (INDEPENDENT_AMBULATORY_CARE_PROVIDER_SITE_OTHER): Payer: Medicare Other

## 2022-11-08 DIAGNOSIS — I639 Cerebral infarction, unspecified: Secondary | ICD-10-CM | POA: Diagnosis not present

## 2022-11-08 LAB — CUP PACEART REMOTE DEVICE CHECK: Date Time Interrogation Session: 20240708232117

## 2022-12-01 NOTE — Progress Notes (Signed)
Carelink Summary Report / Loop Recorder 

## 2022-12-11 LAB — CUP PACEART REMOTE DEVICE CHECK
Date Time Interrogation Session: 20240810232126
Implantable Pulse Generator Implant Date: 20210422

## 2022-12-12 ENCOUNTER — Ambulatory Visit (INDEPENDENT_AMBULATORY_CARE_PROVIDER_SITE_OTHER): Payer: Medicare Other

## 2022-12-12 DIAGNOSIS — I639 Cerebral infarction, unspecified: Secondary | ICD-10-CM | POA: Diagnosis not present

## 2022-12-27 NOTE — Progress Notes (Signed)
Carelink Summary Report / Loop Recorder 

## 2023-01-15 LAB — CUP PACEART REMOTE DEVICE CHECK
Date Time Interrogation Session: 20240912232554
Implantable Pulse Generator Implant Date: 20210422

## 2023-01-16 ENCOUNTER — Ambulatory Visit (INDEPENDENT_AMBULATORY_CARE_PROVIDER_SITE_OTHER): Payer: Medicare Other

## 2023-01-16 DIAGNOSIS — I639 Cerebral infarction, unspecified: Secondary | ICD-10-CM | POA: Diagnosis not present

## 2023-02-01 NOTE — Progress Notes (Signed)
Carelink Summary Report / Loop Recorder 

## 2023-02-20 ENCOUNTER — Ambulatory Visit: Payer: Medicare Other

## 2023-02-20 DIAGNOSIS — I639 Cerebral infarction, unspecified: Secondary | ICD-10-CM | POA: Diagnosis not present

## 2023-02-20 LAB — CUP PACEART REMOTE DEVICE CHECK
Date Time Interrogation Session: 20241020231326
Implantable Pulse Generator Implant Date: 20210422

## 2023-03-09 NOTE — Progress Notes (Signed)
Carelink Summary Report / Loop Recorder 

## 2023-03-27 ENCOUNTER — Ambulatory Visit (INDEPENDENT_AMBULATORY_CARE_PROVIDER_SITE_OTHER): Payer: Medicare Other

## 2023-03-27 DIAGNOSIS — I639 Cerebral infarction, unspecified: Secondary | ICD-10-CM

## 2023-03-27 LAB — CUP PACEART REMOTE DEVICE CHECK
Date Time Interrogation Session: 20241122230141
Implantable Pulse Generator Implant Date: 20210422

## 2023-04-24 NOTE — Progress Notes (Signed)
Carelink Summary Report / Loop Recorder 

## 2023-05-01 ENCOUNTER — Ambulatory Visit (INDEPENDENT_AMBULATORY_CARE_PROVIDER_SITE_OTHER): Payer: Medicare Other

## 2023-05-01 DIAGNOSIS — I639 Cerebral infarction, unspecified: Secondary | ICD-10-CM | POA: Diagnosis not present

## 2023-05-01 LAB — CUP PACEART REMOTE DEVICE CHECK
Date Time Interrogation Session: 20241227231414
Implantable Pulse Generator Implant Date: 20210422

## 2023-06-05 ENCOUNTER — Ambulatory Visit (INDEPENDENT_AMBULATORY_CARE_PROVIDER_SITE_OTHER): Payer: Medicare Other

## 2023-06-05 DIAGNOSIS — I639 Cerebral infarction, unspecified: Secondary | ICD-10-CM

## 2023-06-05 LAB — CUP PACEART REMOTE DEVICE CHECK
Date Time Interrogation Session: 20250202231812
Implantable Pulse Generator Implant Date: 20210422

## 2023-06-16 ENCOUNTER — Telehealth: Payer: Self-pay

## 2023-06-16 NOTE — Telephone Encounter (Signed)
LMTCB to notify of ILR @ [facility] and assess what pt would like to do moving forward.   Alert remote transmission: Battery at [facility] since 06/15/23; routed to clinic for review.  Follow up as scheduled. ILR @ [facility]   ILR reached [facility]: 06/15/23 Patient called, discussed options to leave device in or explanted.  Patient would like to   Advised if further questions arise to please call the device clinic at [phone].

## 2023-06-21 NOTE — Telephone Encounter (Signed)
Patient's son is returning call and is requesting return call.

## 2023-06-21 NOTE — Telephone Encounter (Signed)
 ILR reached [facility]:  LMTCB with patient's listed son Marena Chancy) who speaks good english to discuss options to leave device in or explant.  Patient would like to   Marked "I" in Paceart: Done   Enter note in Paceart: Done Canceled future remotes: Done Discontinued from website: Done Entered in Speciality Comments: Done  Advised if further questions arise to please call the device clinic at [phone].

## 2023-06-21 NOTE — Telephone Encounter (Signed)
 Called patient's son back and LMTCB.

## 2023-06-21 NOTE — Telephone Encounter (Signed)
 Pt son would like for Toni Davila to give him a call back at [phone].

## 2023-06-21 NOTE — Telephone Encounter (Signed)
 Spoke with patient's son and discussed options for [facility] ILR device.  The son said he would like to speak with his sister and his mother (patient) regarding what the mother would like to do. Due to inclement weather, the clinic is now closed, so this RN informed the son to call the clinic back tomorrow after 10am when the clinic re-opens with their decision at [phone].

## 2023-07-12 NOTE — Progress Notes (Signed)
 [facility] Summary Report / Loop Recorder

## 2024-02-28 DIAGNOSIS — R7303 Prediabetes: Secondary | ICD-10-CM | POA: Diagnosis not present

## 2024-02-28 DIAGNOSIS — D509 Iron deficiency anemia, unspecified: Secondary | ICD-10-CM | POA: Diagnosis not present

## 2024-02-28 DIAGNOSIS — E785 Hyperlipidemia, unspecified: Secondary | ICD-10-CM | POA: Diagnosis not present

## 2024-02-28 DIAGNOSIS — Z23 Encounter for immunization: Secondary | ICD-10-CM | POA: Diagnosis not present

## 2024-02-28 DIAGNOSIS — L989 Disorder of the skin and subcutaneous tissue, unspecified: Secondary | ICD-10-CM | POA: Diagnosis not present

## 2024-02-28 DIAGNOSIS — I10 Essential (primary) hypertension: Secondary | ICD-10-CM | POA: Diagnosis not present

## 2024-02-28 DIAGNOSIS — Z0000 Encounter for general adult medical examination without abnormal findings: Secondary | ICD-10-CM | POA: Diagnosis not present
